# Patient Record
Sex: Male | Born: 1955 | Race: Black or African American | Hispanic: No | Marital: Single | Smoking: Never smoker
Health system: Southern US, Community
[De-identification: ages and names within clinical notes are randomized; demographics above are authoritative.]

## PROBLEM LIST (undated history)

## (undated) DIAGNOSIS — E119 Type 2 diabetes mellitus without complications: Secondary | ICD-10-CM

## (undated) DIAGNOSIS — I2510 Atherosclerotic heart disease of native coronary artery without angina pectoris: Secondary | ICD-10-CM

## (undated) DIAGNOSIS — I219 Acute myocardial infarction, unspecified: Secondary | ICD-10-CM

## (undated) DIAGNOSIS — I10 Essential (primary) hypertension: Secondary | ICD-10-CM

## (undated) DIAGNOSIS — K219 Gastro-esophageal reflux disease without esophagitis: Secondary | ICD-10-CM

## (undated) DIAGNOSIS — F32A Depression, unspecified: Secondary | ICD-10-CM

## (undated) DIAGNOSIS — N183 Chronic kidney disease, stage 3 unspecified: Secondary | ICD-10-CM

## (undated) DIAGNOSIS — I428 Other cardiomyopathies: Secondary | ICD-10-CM

## (undated) DIAGNOSIS — I639 Cerebral infarction, unspecified: Secondary | ICD-10-CM

## (undated) DIAGNOSIS — E7800 Pure hypercholesterolemia, unspecified: Secondary | ICD-10-CM

## (undated) DIAGNOSIS — F329 Major depressive disorder, single episode, unspecified: Secondary | ICD-10-CM

## (undated) DIAGNOSIS — Z992 Dependence on renal dialysis: Secondary | ICD-10-CM

## (undated) HISTORY — PX: CARDIAC CATHETERIZATION: SHX172

## (undated) HISTORY — DX: Depression, unspecified: F32.A

## (undated) HISTORY — DX: Type 2 diabetes mellitus without complications: E11.9

## (undated) HISTORY — DX: Chronic kidney disease, stage 3 (moderate): N18.3

## (undated) HISTORY — DX: Chronic kidney disease, stage 3 unspecified: N18.30

## (undated) HISTORY — DX: Atherosclerotic heart disease of native coronary artery without angina pectoris: I25.10

## (undated) HISTORY — DX: Major depressive disorder, single episode, unspecified: F32.9

---

## 2010-07-26 ENCOUNTER — Emergency Department (HOSPITAL_BASED_OUTPATIENT_CLINIC_OR_DEPARTMENT_OTHER)
Admission: EM | Admit: 2010-07-26 | Discharge: 2010-07-26 | Disposition: A | Discharge status: Home / Self Care | Payer: Medicaid Other | Attending: Emergency Medicine | Admitting: Emergency Medicine

## 2010-07-26 DIAGNOSIS — K029 Dental caries, unspecified: Secondary | ICD-10-CM | POA: Insufficient documentation

## 2010-07-26 DIAGNOSIS — K219 Gastro-esophageal reflux disease without esophagitis: Secondary | ICD-10-CM | POA: Insufficient documentation

## 2010-07-26 DIAGNOSIS — Z8679 Personal history of other diseases of the circulatory system: Secondary | ICD-10-CM | POA: Insufficient documentation

## 2010-07-26 DIAGNOSIS — E119 Type 2 diabetes mellitus without complications: Secondary | ICD-10-CM | POA: Insufficient documentation

## 2010-07-26 DIAGNOSIS — I10 Essential (primary) hypertension: Secondary | ICD-10-CM | POA: Insufficient documentation

## 2010-10-15 ENCOUNTER — Encounter: Payer: Self-pay | Admitting: *Deleted

## 2010-10-15 ENCOUNTER — Emergency Department (HOSPITAL_BASED_OUTPATIENT_CLINIC_OR_DEPARTMENT_OTHER)
Admission: EM | Admit: 2010-10-15 | Discharge: 2010-10-15 | Disposition: A | Discharge status: Home / Self Care | Payer: Medicaid Other | Attending: Emergency Medicine | Admitting: Emergency Medicine

## 2010-10-15 DIAGNOSIS — I10 Essential (primary) hypertension: Secondary | ICD-10-CM | POA: Insufficient documentation

## 2010-10-15 DIAGNOSIS — E7800 Pure hypercholesterolemia, unspecified: Secondary | ICD-10-CM | POA: Insufficient documentation

## 2010-10-15 DIAGNOSIS — I252 Old myocardial infarction: Secondary | ICD-10-CM | POA: Insufficient documentation

## 2010-10-15 DIAGNOSIS — L509 Urticaria, unspecified: Secondary | ICD-10-CM | POA: Insufficient documentation

## 2010-10-15 DIAGNOSIS — Z8679 Personal history of other diseases of the circulatory system: Secondary | ICD-10-CM | POA: Insufficient documentation

## 2010-10-15 HISTORY — DX: Acute myocardial infarction, unspecified: I21.9

## 2010-10-15 HISTORY — DX: Cerebral infarction, unspecified: I63.9

## 2010-10-15 HISTORY — DX: Pure hypercholesterolemia, unspecified: E78.00

## 2010-10-15 HISTORY — DX: Essential (primary) hypertension: I10

## 2010-10-15 MED ORDER — FAMOTIDINE IN NACL 20-0.9 MG/50ML-% IV SOLN
20.0000 mg | Freq: Once | INTRAVENOUS | Status: AC
Start: 2010-10-15 — End: 2010-10-15
  Administered 2010-10-15: 20 mg via INTRAVENOUS
  Filled 2010-10-15: qty 50

## 2010-10-15 MED ORDER — DIPHENHYDRAMINE HCL 50 MG/ML IJ SOLN
25.0000 mg | Freq: Once | INTRAMUSCULAR | Status: AC
  Administered 2010-10-15: 25 mg via INTRAVENOUS
  Filled 2010-10-15: qty 1

## 2010-10-15 MED ORDER — PREDNISONE 10 MG PO TABS: ORAL_TABLET | ORAL | Status: DC

## 2010-10-15 NOTE — ED Notes (Signed)
Lying [id number] Sitting 164/100, 78 Standing [id number]

## 2010-10-15 NOTE — ED Provider Notes (Signed)
History     CSN: [id number] Arrival date & time: 10/15/2010  5:38 PM  Chief Complaint  Patient presents with  . Allergic Reaction   Patient is a 55 y.o. male presenting with rash. The history is provided by the patient.  Rash  This is a new problem. The current episode started 12 to 24 hours ago. The problem has been gradually worsening. The problem is associated with nothing. There has been no fever. The rash is present on the torso, back, trunk, left upper leg, left lower leg, left arm, right arm, right lower leg and right upper leg. The pain is at a severity of 0/10. The patient is experiencing no pain. The pain has been constant since onset. Associated symptoms include itching. He has tried antihistamines for the symptoms. Risk factors: no known.   patient has full body rash. Patient reports no new medicines no new soaps or cosmetics. Patient reports no new foods patient's wife reports she frequently has hives however patient has never had hives in the past patient complains of severe itching  Past Medical History  Diagnosis Date  . Hypertension   . Hypercholesteremia   . Stroke   . Myocardial infarct     History reviewed. No pertinent past surgical history.  History reviewed. No pertinent family history.  History  Substance Use Topics  . Smoking status: Never Smoker   . Smokeless tobacco: Not on file  . Alcohol Use: Yes      Review of Systems  Skin: Positive for itching and rash.  All other systems reviewed and are negative.    Physical Exam  BP 147/84  Pulse 70  Temp(Src) 98.7 F (37.1 C) (Oral)  Resp 20  Ht 5\' 11"  (1.803 m)  Wt 188 lb (85.276 kg)  BMI 26.22 kg/m2  SpO2 96%  Physical Exam  Nursing note and vitals reviewed. Constitutional: He appears well-developed and well-nourished.  HENT:  Head: Normocephalic and atraumatic.  Right Ear: External ear normal.  Left Ear: External ear normal.  Eyes: Conjunctivae and EOM are normal. Pupils are equal,  round, and reactive to light.  Neck: Normal range of motion. Neck supple.  Cardiovascular: Normal rate.   Pulmonary/Chest: Effort normal.  Abdominal: Soft.  Musculoskeletal: Normal range of motion.  Neurological: He is alert.  Skin: Rash noted.  Psychiatric: He has a normal mood and affect.    ED Course  Procedures  MDM Patient reports he feels better after Pepcid and Benadryl IV rash is fading and decreased itching. Patient advised to continue Pepcid and Benadryl he is given a prescription for prednisone. He is to see his physician for recheck in 2-3 days return to the emergency department if symptoms worsen or change      Alyse Low, [place] 10/16/10 [place], [place] 10/16/10 1843  Evaluation and management procedures were performed by the PA/NP under my supervision/collaboration.   Charlena Cross, MD 10/17/10 [phone]

## 2010-10-15 NOTE — ED Notes (Signed)
Pt noticed raised reddened ares to body onset last p.m. No known exposure/ingestion to unusual or new substances.

## 2011-01-11 ENCOUNTER — Emergency Department (HOSPITAL_BASED_OUTPATIENT_CLINIC_OR_DEPARTMENT_OTHER)
Admission: EM | Admit: 2011-01-11 | Discharge: 2011-01-11 | Disposition: A | Discharge status: Home / Self Care | Payer: Medicaid Other | Attending: Emergency Medicine | Admitting: Emergency Medicine

## 2011-01-11 ENCOUNTER — Encounter (HOSPITAL_BASED_OUTPATIENT_CLINIC_OR_DEPARTMENT_OTHER): Payer: Self-pay

## 2011-01-11 DIAGNOSIS — Z79899 Other long term (current) drug therapy: Secondary | ICD-10-CM | POA: Insufficient documentation

## 2011-01-11 DIAGNOSIS — I252 Old myocardial infarction: Secondary | ICD-10-CM | POA: Insufficient documentation

## 2011-01-11 DIAGNOSIS — L509 Urticaria, unspecified: Secondary | ICD-10-CM | POA: Insufficient documentation

## 2011-01-11 DIAGNOSIS — E7800 Pure hypercholesterolemia, unspecified: Secondary | ICD-10-CM | POA: Insufficient documentation

## 2011-01-11 DIAGNOSIS — Z8679 Personal history of other diseases of the circulatory system: Secondary | ICD-10-CM | POA: Insufficient documentation

## 2011-01-11 DIAGNOSIS — H6120 Impacted cerumen, unspecified ear: Secondary | ICD-10-CM | POA: Insufficient documentation

## 2011-01-11 DIAGNOSIS — H9209 Otalgia, unspecified ear: Secondary | ICD-10-CM | POA: Insufficient documentation

## 2011-01-11 DIAGNOSIS — I10 Essential (primary) hypertension: Secondary | ICD-10-CM | POA: Insufficient documentation

## 2011-01-11 MED ORDER — DIPHENHYDRAMINE HCL 25 MG PO CAPS
ORAL_CAPSULE | ORAL | Status: AC
  Filled 2011-01-11: qty 2

## 2011-01-11 MED ORDER — FAMOTIDINE 20 MG PO TABS
20.0000 mg | ORAL_TABLET | Freq: Once | ORAL | Status: AC
Start: 2011-01-11 — End: 2011-01-11
  Administered 2011-01-11: 20 mg via ORAL
  Filled 2011-01-11: qty 1

## 2011-01-11 MED ORDER — DIPHENHYDRAMINE HCL 50 MG PO CAPS
50.0000 mg | ORAL_CAPSULE | Freq: Once | ORAL | Status: AC
  Administered 2011-01-11: 50 mg via ORAL
  Filled 2011-01-11: qty 1

## 2011-01-11 NOTE — ED Provider Notes (Signed)
History     CSN: [id number] Arrival date & time: 01/11/2011  5:55 PM   First MD Initiated Contact with Patient 01/11/11 1743      Chief Complaint  Patient presents with  . Urticaria  . Otalgia    (Consider location/radiation/quality/duration/timing/severity/associated sxs/prior treatment) Patient is a 55 y.o. male presenting with urticaria and ear pain. The history is provided by the patient. No language interpreter was used.  Urticaria This is a new problem. The current episode started today. The problem occurs constantly. The problem has been gradually worsening. The symptoms are aggravated by nothing. He has tried nothing for the symptoms. The treatment provided no relief.  Otalgia This is a chronic problem. The current episode started more than 1 week ago. There is pain in both ears. The problem occurs constantly. The problem has not changed since onset.There has been no fever. The patient is experiencing no pain. Associated symptoms include ear discharge and hearing loss.  Pt complains of wax in both ears and hives.  Pt has had hives in the past.  Past Medical History  Diagnosis Date  . Hypertension   . Hypercholesteremia   . Stroke   . Myocardial infarct     History reviewed. No pertinent past surgical history.  No family history on file.  History  Substance Use Topics  . Smoking status: Never Smoker   . Smokeless tobacco: Not on file  . Alcohol Use: Yes      Review of Systems  Unable to perform ROS HENT: Positive for hearing loss, ear pain and ear discharge.   All other systems reviewed and are negative.    Allergies  Review of patient's allergies indicates no known allergies.  Home Medications   Current Outpatient Rx  Name Route Sig Dispense Refill  . AMLODIPINE BESYLATE 10 MG PO TABS Oral Take 10 mg by mouth daily.      . ASPIRIN 81 MG PO TABS Oral Take 81 mg by mouth daily.      Marland Kitchen GLIPIZIDE 10 MG PO TABS Oral Take 10 mg by mouth 2 (two) times daily  before a meal.      . METOPROLOL SUCCINATE 100 MG PO TB24 Oral Take 50 mg by mouth daily.      Marland Kitchen OMEPRAZOLE 20 MG PO CPDR Oral Take 20 mg by mouth daily.      Marland Kitchen PRAVASTATIN SODIUM 40 MG PO TABS Oral Take 40 mg by mouth at bedtime.     . TOLTERODINE TARTRATE 2 MG PO CP24 Oral Take 2 mg by mouth daily.      Marland Kitchen PREDNISONE 10 MG PO TABS  5,4,3,2,1 taper 15 tablet 0    BP 158/86  Pulse 65  Temp(Src) 98.8 F (37.1 C) (Oral)  Resp 18  Ht 5\' 11"  (1.803 m)  Wt 180 lb (81.647 kg)  BMI 25.10 kg/m2  SpO2 96%  Physical Exam  Nursing note and vitals reviewed. Constitutional: He appears well-developed and well-nourished.  HENT:  Head: Normocephalic and atraumatic.  Nose: Nose normal.  Mouth/Throat: Oropharynx is clear and moist.       bilat tm's occluded by wax  Eyes: Conjunctivae and EOM are normal. Pupils are equal, round, and reactive to light.  Cardiovascular: Normal rate and regular rhythm.   Pulmonary/Chest: Effort normal and breath sounds normal.    ED Course  Procedures (including critical care time)  Labs Reviewed - No data to display No results found.   No diagnosis found.    MDM  reexam tms clear,  I advised benadryl  25 mg every 4 hours.  Pepcid 20 mg every 12 hours        Alyse Low, [place] 01/11/11 [phone]

## 2011-01-11 NOTE — ED Notes (Signed)
C/o hives since yesterday-oral surgeon advised pt today that both ears are stopped up

## 2011-01-11 NOTE — ED Notes (Signed)
Washed both ears out with warm water large amount  of wax out in each ear.

## 2011-01-11 NOTE — ED Provider Notes (Signed)
Medical screening examination/treatment/procedure(s) were performed by non-physician practitioner and as supervising physician I was immediately available for consultation/collaboration.   Nameer Summer A. Lauris Poag, MD 01/11/11 2125

## 2011-01-11 NOTE — ED Notes (Signed)
D/c home with ride- no Rx given- pt instructed to take Benadryl 25mg  every 4 hours and pepcid 20mg  every 12 hours by EDPA Threasa Alpha. Pt and family verbalize understanding

## 2011-01-11 NOTE — ED Notes (Signed)
EDP Sofia at bedside to reassess and discuss d/c care with pt and family

## 2011-01-16 ENCOUNTER — Encounter (HOSPITAL_BASED_OUTPATIENT_CLINIC_OR_DEPARTMENT_OTHER): Payer: Self-pay | Admitting: *Deleted

## 2011-01-16 NOTE — Progress Notes (Signed)
Dr. Landry Dyke paged and informed of cardiac hx of cardiomyopathy for dental extractions under general anesthesia.last cath '06 w/ EF 30%the patient is a pt of [facility]. Dr. Buelah Manis attempted unsuccessfully in the office.  Pt became combative. They opted to come here for gen anesthesia.  No new orders received.  Dr. Winfred Leeds to decide in am.  "It's too late now" per  [place]. Note on chart for Dr. Winfred Leeds.

## 2011-01-16 NOTE — Progress Notes (Signed)
Spoke w/ pt sister, Caren Griffins. NPO after MN. Pt to arrive at [place]. Needs istat and EKG. Will take norvasc, prilosec, and toprol.

## 2011-01-17 ENCOUNTER — Other Ambulatory Visit: Payer: Self-pay

## 2011-01-17 ENCOUNTER — Encounter (HOSPITAL_BASED_OUTPATIENT_CLINIC_OR_DEPARTMENT_OTHER): Payer: Self-pay | Admitting: Anesthesiology

## 2011-01-17 ENCOUNTER — Encounter (HOSPITAL_BASED_OUTPATIENT_CLINIC_OR_DEPARTMENT_OTHER)
Admission: RE | Disposition: A | Discharge status: Home / Self Care | Payer: Self-pay | Source: Ambulatory Visit | Attending: Oral and Maxillofacial Surgery

## 2011-01-17 ENCOUNTER — Ambulatory Visit (HOSPITAL_BASED_OUTPATIENT_CLINIC_OR_DEPARTMENT_OTHER)
Admission: RE | Admit: 2011-01-17 | Discharge: 2011-01-17 | Disposition: A | Discharge status: Home / Self Care | Payer: Medicaid Other | Source: Ambulatory Visit | Attending: Oral and Maxillofacial Surgery | Admitting: Oral and Maxillofacial Surgery

## 2011-01-17 DIAGNOSIS — Z8673 Personal history of transient ischemic attack (TIA), and cerebral infarction without residual deficits: Secondary | ICD-10-CM | POA: Insufficient documentation

## 2011-01-17 DIAGNOSIS — Z538 Procedure and treatment not carried out for other reasons: Secondary | ICD-10-CM | POA: Insufficient documentation

## 2011-01-17 DIAGNOSIS — I10 Essential (primary) hypertension: Secondary | ICD-10-CM | POA: Insufficient documentation

## 2011-01-17 DIAGNOSIS — K219 Gastro-esophageal reflux disease without esophagitis: Secondary | ICD-10-CM | POA: Insufficient documentation

## 2011-01-17 DIAGNOSIS — Z79899 Other long term (current) drug therapy: Secondary | ICD-10-CM | POA: Insufficient documentation

## 2011-01-17 DIAGNOSIS — I2510 Atherosclerotic heart disease of native coronary artery without angina pectoris: Secondary | ICD-10-CM | POA: Insufficient documentation

## 2011-01-17 DIAGNOSIS — E119 Type 2 diabetes mellitus without complications: Secondary | ICD-10-CM | POA: Insufficient documentation

## 2011-01-17 DIAGNOSIS — K029 Dental caries, unspecified: Secondary | ICD-10-CM | POA: Insufficient documentation

## 2011-01-17 DIAGNOSIS — I252 Old myocardial infarction: Secondary | ICD-10-CM | POA: Insufficient documentation

## 2011-01-17 HISTORY — DX: Gastro-esophageal reflux disease without esophagitis: K21.9

## 2011-01-17 HISTORY — DX: Other cardiomyopathies: I42.8

## 2011-01-17 HISTORY — DX: Atherosclerotic heart disease of native coronary artery without angina pectoris: I25.10

## 2011-01-17 LAB — POCT I-STAT 4, (NA,K, GLUC, HGB,HCT): Hemoglobin: 14.3 g/dL (ref 13.0–17.0)

## 2011-01-17 SURGERY — DENTAL RESTORATION/EXTRACTIONS
Anesthesia: General

## 2011-01-17 MED ORDER — LACTATED RINGERS IV SOLN
INTRAVENOUS | Status: DC
  Administered 2011-01-17: 07:00:00 via INTRAVENOUS

## 2011-01-17 MED ORDER — CLINDAMYCIN PHOSPHATE 600 MG/50ML IV SOLN: 600.0000 mg | Freq: Once | INTRAVENOUS | Status: DC

## 2011-01-17 SURGICAL SUPPLY — 33 items
BLADE SURG 15 STRL LF DISP TIS (BLADE) IMPLANT
BLADE SURG 15 STRL SS (BLADE)
BUR CROSS CUT TIS (BURR) IMPLANT
BUR CRS CUT 2.1 (BURR) IMPLANT
BUR SIDE CUT (BURR) IMPLANT
BUR SURG 4X8 MED (BURR) IMPLANT
BURR SURG 4X8 MED (BURR)
CANISTER SUCTION 2500CC (MISCELLANEOUS) IMPLANT
CLOTH BEACON ORANGE TIMEOUT ST (SAFETY) IMPLANT
ELECT NEEDLE BLADE 2-5/6 (NEEDLE) IMPLANT
ELECT REM PT RETURN 9FT ADLT (ELECTROSURGICAL)
ELECTRODE REM PT RTRN 9FT ADLT (ELECTROSURGICAL) IMPLANT
GAUZE SPONGE 4X4 16PLY XRAY LF (GAUZE/BANDAGES/DRESSINGS) IMPLANT
GLOVE BIO SURGEON STRL SZ 6.5 (GLOVE) IMPLANT
GLOVE BIOGEL PI IND STRL 6.5 (GLOVE) IMPLANT
GLOVE BIOGEL PI IND STRL 7.5 (GLOVE) IMPLANT
GLOVE BIOGEL PI INDICATOR 6.5 (GLOVE)
GLOVE BIOGEL PI INDICATOR 7.5 (GLOVE)
GLOVE ORTHO TXT STRL SZ7.5 (GLOVE) IMPLANT
GLOVE SURG SS PI 8.0 STRL IVOR (GLOVE) IMPLANT
GOWN PREVENTION PLUS LG XLONG (DISPOSABLE) IMPLANT
NEEDLE DENTAL 27 LONG (NEEDLE) IMPLANT
NS IRRIG 1000ML POUR BTL (IV SOLUTION) IMPLANT
PACK BASIN DAY SURGERY FS (CUSTOM PROCEDURE TRAY) IMPLANT
PACK ENT DAY SURGERY (CUSTOM PROCEDURE TRAY) IMPLANT
PACKING VAGINAL (PACKING) IMPLANT
PENCIL BUTTON HOLSTER BLD 10FT (ELECTRODE) IMPLANT
SUT CHROMIC 3 0 PS 2 (SUTURE) IMPLANT
SYR 50ML LL SCALE MARK (SYRINGE) IMPLANT
TOWEL OR 17X24 6PK STRL BLUE (TOWEL DISPOSABLE) IMPLANT
VESSEL CANN W0 1 W VA 30003 (MISCELLANEOUS) IMPLANT
WATER STERILE IRR 1000ML POUR (IV SOLUTION) IMPLANT
YANKAUER SUCT BULB TIP NO VENT (SUCTIONS) IMPLANT

## 2011-01-17 NOTE — H&P (Signed)
  Date of Initial H&P: 01/17/11  History reviewed, patient examined, no change in status, stable for surgery.

## 2011-01-17 NOTE — Anesthesia Preprocedure Evaluation (Addendum)
Anesthesia Evaluation  Patient identified by MRN, date of birth, ID band Patient awake    Reviewed: Allergy & Precautions, H&P , NPO status , Patient's Chart, lab work & pertinent test results, reviewed documented beta blocker date and time   History of Anesthesia Complications Negative for: history of anesthetic complications  Airway Mallampati: III TM Distance: >3 FB     Dental  (+) Poor Dentition   Pulmonary    Pulmonary exam normal       Cardiovascular Exercise Tolerance: Poor hypertension, Pt. on medications and Pt. on home beta blockers + CAD and + Past MI Regular     Neuro/Psych CVA, Residual Symptoms    GI/Hepatic Neg liver ROS, GERD-  Medicated and Controlled,  Endo/Other  Diabetes mellitus-, Well Controlled, Type 2, Oral Hypoglycemic Agents  Renal/GU negative Renal ROS     Musculoskeletal   Abdominal   Peds  Hematology negative hematology ROS (+)   Anesthesia Other Findings   Reproductive/Obstetrics                          Anesthesia Physical Anesthesia Plan  ASA: III  Anesthesia Plan: General   Post-op Pain Management:    Induction: Intravenous  Airway Management Planned: Nasal ETT  Additional Equipment:   Intra-op Plan:   Post-operative Plan: Extubation in OR  Informed Consent: I have reviewed the patients History and Physical, chart, labs and discussed the procedure including the risks, benefits and alternatives for the proposed anesthesia with the patient or authorized representative who has indicated his/her understanding and acceptance.   Dental advisory given  Plan Discussed with: CRNA  Anesthesia Plan Comments:         Anesthesia Quick Evaluation

## 2011-01-17 NOTE — Procedures (Signed)
After chart reviewed per Dr Judeth Horn made to cancel procedure based on cardiac notes from 2006-no known updated workup.

## 2011-01-17 NOTE — Progress Notes (Signed)
Procedure today (01/17/11) cancelled by anesthesia due to cardiac history.

## 2012-01-12 ENCOUNTER — Emergency Department (HOSPITAL_BASED_OUTPATIENT_CLINIC_OR_DEPARTMENT_OTHER): Payer: Medicaid Other

## 2012-01-12 ENCOUNTER — Emergency Department (HOSPITAL_BASED_OUTPATIENT_CLINIC_OR_DEPARTMENT_OTHER)
Admission: EM | Admit: 2012-01-12 | Discharge: 2012-01-12 | Disposition: A | Discharge status: Other Acute Inpatient Hospital | Payer: Medicaid Other | Attending: Emergency Medicine | Admitting: Emergency Medicine

## 2012-01-12 ENCOUNTER — Encounter (HOSPITAL_BASED_OUTPATIENT_CLINIC_OR_DEPARTMENT_OTHER): Payer: Self-pay | Admitting: *Deleted

## 2012-01-12 DIAGNOSIS — K219 Gastro-esophageal reflux disease without esophagitis: Secondary | ICD-10-CM | POA: Insufficient documentation

## 2012-01-12 DIAGNOSIS — I2510 Atherosclerotic heart disease of native coronary artery without angina pectoris: Secondary | ICD-10-CM | POA: Insufficient documentation

## 2012-01-12 DIAGNOSIS — I10 Essential (primary) hypertension: Secondary | ICD-10-CM

## 2012-01-12 DIAGNOSIS — Z7982 Long term (current) use of aspirin: Secondary | ICD-10-CM | POA: Insufficient documentation

## 2012-01-12 DIAGNOSIS — Z8673 Personal history of transient ischemic attack (TIA), and cerebral infarction without residual deficits: Secondary | ICD-10-CM | POA: Insufficient documentation

## 2012-01-12 DIAGNOSIS — Z8679 Personal history of other diseases of the circulatory system: Secondary | ICD-10-CM | POA: Insufficient documentation

## 2012-01-12 DIAGNOSIS — R29810 Facial weakness: Secondary | ICD-10-CM | POA: Insufficient documentation

## 2012-01-12 DIAGNOSIS — I252 Old myocardial infarction: Secondary | ICD-10-CM | POA: Insufficient documentation

## 2012-01-12 DIAGNOSIS — E7800 Pure hypercholesterolemia, unspecified: Secondary | ICD-10-CM | POA: Insufficient documentation

## 2012-01-12 DIAGNOSIS — R471 Dysarthria and anarthria: Secondary | ICD-10-CM | POA: Insufficient documentation

## 2012-01-12 DIAGNOSIS — Z79899 Other long term (current) drug therapy: Secondary | ICD-10-CM | POA: Insufficient documentation

## 2012-01-12 DIAGNOSIS — E119 Type 2 diabetes mellitus without complications: Secondary | ICD-10-CM | POA: Insufficient documentation

## 2012-01-12 LAB — URINALYSIS, ROUTINE W REFLEX MICROSCOPIC
Ketones, ur: NEGATIVE mg/dL
Leukocytes, UA: NEGATIVE
Nitrite: NEGATIVE
Specific Gravity, Urine: 1.012 (ref 1.005–1.030)
Urobilinogen, UA: 1 mg/dL (ref 0.0–1.0)
pH: 7 (ref 5.0–8.0)

## 2012-01-12 LAB — BASIC METABOLIC PANEL
BUN: 18 mg/dL (ref 6–23)
Chloride: 98 mEq/L (ref 96–112)
Glucose, Bld: 183 mg/dL — ABNORMAL HIGH (ref 70–99)
Potassium: 3.5 mEq/L (ref 3.5–5.1)

## 2012-01-12 LAB — APTT: aPTT: 30 seconds (ref 24–37)

## 2012-01-12 LAB — GLUCOSE, CAPILLARY

## 2012-01-12 LAB — CBC
HCT: 41.1 % (ref 39.0–52.0)
Hemoglobin: 14.3 g/dL (ref 13.0–17.0)
RBC: 5.4 MIL/uL (ref 4.22–5.81)
WBC: 5.1 10*3/uL (ref 4.0–10.5)

## 2012-01-12 NOTE — ED Notes (Signed)
Stroke swallow screen not completed due to aphasia, cough, and unable to control saliva.

## 2012-01-12 NOTE — ED Notes (Signed)
Report to Shawn RN at [facility] ED.

## 2012-01-12 NOTE — ED Provider Notes (Signed)
History     CSN: [id number]  Arrival date & time 01/12/12  1042   First MD Initiated Contact with Patient 01/12/12 1046      Chief Complaint  Patient presents with  . Aphasia    (Consider location/radiation/quality/duration/timing/severity/associated sxs/prior treatment) HPI A LEVEL 5 CAVEAT PERTAINS DUE TO DYSARTHRIA Pt presents with c/o slurred speech as well as right sided facial droop.  Hx obtained from sister who is accompanying him.  She states he went to bed last night and was feeling well, this morning woke up with these symptoms.  Has hx of stroke x 2 and MI, HTN.  She states strokes in the past have not caused these similar symptoms- but instead have caused memory problems.  Pt denies any arm or leg weakness.  No chest pain, no sob, no headache, no vomiting.    Past Medical History  Diagnosis Date  . Hypertension   . Hypercholesteremia   . Coronary artery disease     goes to [place] in [place] every 6 months  . Myocardial infarct 2006 & 2007    s/p cardiac cath 2006-  medical intervention  . Non-ischemic cardiomyopathy hx 2006-- ef 10-15%    last echo documented 2006 w/ chart  . Stroke one yrs ago & one 2008     residual - periphral vision limited  . Diabetes mellitus     oral med  . GERD (gastroesophageal reflux disease)     controlled w/ prilosec    Past Surgical History  Procedure Date  . Cardiac catheterization 2006 in [place]    results w/ chart (provided by sister)    No family history on file.  History  Substance Use Topics  . Smoking status: Never Smoker   . Smokeless tobacco: Not on file  . Alcohol Use: Yes     Comment: occassionally      Review of Systems UNABLE TO OBTAIN FULL ROS DUE TO LEVEL 5 CAVEAT  Allergies  Review of patient's allergies indicates no known allergies.  Home Medications   Current Outpatient Rx  Name  Route  Sig  Dispense  Refill  . ASPIRIN 81 MG PO TABS   Oral   Take 81 mg by mouth every morning.         Marland Kitchen LISINOPRIL 20 MG PO TABS   Oral   Take 20 mg by mouth daily.         Marland Kitchen AMLODIPINE BESYLATE 10 MG PO TABS   Oral   Take 10 mg by mouth every morning.          Marland Kitchen DIPHENHYDRAMINE HCL (SLEEP) 25 MG PO TABS   Oral   Take 25 mg by mouth as needed. For hives          . FAMOTIDINE 20 MG PO TABS   Oral   Take 20 mg by mouth 2 (two) times daily as needed.           Marland Kitchen GLIPIZIDE 10 MG PO TABS   Oral   Take 10 mg by mouth 2 (two) times daily before a meal.          . METOPROLOL SUCCINATE ER 100 MG PO TB24   Oral   Take 50 mg by mouth every morning.          Marland Kitchen OMEPRAZOLE 20 MG PO CPDR   Oral   Take 20 mg by mouth every morning.          Marland Kitchen PRAVASTATIN SODIUM 40 MG  PO TABS   Oral   Take 40 mg by mouth at bedtime.          . TOLTERODINE TARTRATE ER 2 MG PO CP24   Oral   Take 2 mg by mouth every morning.            BP 136/88  Pulse 83  Temp 98.7 F (37.1 C) (Oral)  Resp 16  SpO2 99% Vitals reviewed Physical Exam Physical Examination: General appearance - alert, well appearing, and in no distress Mental status - alert, oriented to person, place, and time Eyes - pupils equal and reactive, extraocular eye movements intact Mouth - mucous membranes moist, pharynx normal without lesions Chest - clear to auscultation, no wheezes, rales or rhonchi, symmetric air entry Heart - normal rate, regular rhythm, normal S1, S2, no murmurs, rubs, clicks or gallops Abdomen - soft, nontender, nondistended, no masses or organomegaly Neurological - alert, oriented, dysarthric speech,right sided facial droop, remainder of cranial nerves tested and intact, strength 5/5 in extremities x 4, sensation intact Extremities - peripheral pulses normal, no pedal edema, no clubbing or cyanosis Skin - normal coloration and turgor, no rashes  ED Course  Procedures (including critical care time)   Date: 01/12/2012  Rate: 88  Rhythm: normal sinus rhythm  QRS Axis: normal  Intervals:  NORMAL  ST/T Wave abnormalities: nonspecific st /t wave abnormalities  Conduction Disutrbances:none  Narrative Interpretation:   Old EKG Reviewed: unchanged, no significant changes compared to prior ekg of 02/16/11    2:07 PM d/w Patient and his sister.  They state they would prefer to be admitted at [facility].  I have d/w Dr. Lynnae Sandhoff, [facility]. He accepts the patient and wants him to be transferred to the ED- he states he will decide on what type of bed and admit him from the ED.  He does state that he is the accepting physician.  Dr. Idolina Primer is the ED phsyician.   Labs Reviewed  CBC - Abnormal; Notable for the following:    MCV 76.1 (*)     All other components within normal limits  BASIC METABOLIC PANEL - Abnormal; Notable for the following:    Glucose, Bld 183 (*)     GFR calc non Af Amer 60 (*)     GFR calc Af Amer 70 (*)     All other components within normal limits  GLUCOSE, CAPILLARY - Abnormal; Notable for the following:    Glucose-Capillary 194 (*)     All other components within normal limits  PROTIME-INR  APTT  URINALYSIS, ROUTINE W REFLEX MICROSCOPIC   Ct Head Wo Contrast  01/12/2012  *RADIOLOGY REPORT*  Clinical Data: Facial drooping and slurred speech occurring during the night.  CT HEAD WITHOUT CONTRAST  Technique:  Contiguous axial images were obtained from the base of the skull through the vertex without contrast.  Comparison: None.  Findings: There is ex vacuo dilation of the left lateral ventricle from an old left external capsule infarct.  The ventricles are normal in configuration otherwise and, along the sulci, show mild enlargement reflecting mild atrophy.  There are patchy areas of white matter hypoattenuation consistent with mild chronic micro mass ischemic change and several lacunar infarcts are noted in the basal ganglia and thalami.  No evidence of a recent infarct.  There are no extra-axial masses or abnormal fluid collections.  No  intracranial hemorrhage.  Mucosal thickening lines the ethmoid sinuses and inferior frontal sinus with a small mucous retention cyst  in the left maxillary sinus.  Clear mastoid air cells.  No skull fracture.  IMPRESSION: No acute intracranial abnormalities.   Original Report Authenticated By: Lajean Manes, M.D.      1. Dysarthria   2. Facial droop   3. Hypertension       MDM  Pt presents with onset of facial droop and dysarthria which he noted upon waking up from sleep this morning.  CT head reassuring, EKG, labs also reassuring.  Pt and family requesting to be transferred to [facility].  D/w regional physicians who accepts patient, but requests him to be transferred to the ED where hospitalist will evaluate.  Dr. Idolina Primer in ED also aware of patient.          Threasa Beards, MD 01/12/12 [phone]

## 2012-01-12 NOTE — ED Notes (Signed)
The patient's CBG was 194.

## 2012-01-12 NOTE — ED Notes (Signed)
Sister of patient states she woke up her brother this morning at 0930 am and he was having slurred speech, drooping of his right face and swallowing problems.  States pt went to bed last night at 1030 pm normal.  Hx of 2 strokes in the past and 2 heart attacks.  States only residual effect of past strokes is a mental process slowness, no asphasia, or weakness. Patient walks daily for 1-2 hours at a time.

## 2013-10-26 IMAGING — CT CT HEAD W/O CM
1 series · 16 of 30 positions shown, 20 images · non-contrast
Comparison: None.

CLINICAL DATA: Facial drooping and slurred speech occurring during
the night.

CT HEAD WITHOUT CONTRAST
TECHNIQUE: Contiguous axial images were obtained from the base of
the skull through the vertex without contrast.

[Series 2: head 4.8 h37s · axial · 0.50mm/px · z∈[-134,+2]mm · 16 of 32 slices shown, 20 images]
[im 2/32  brain]
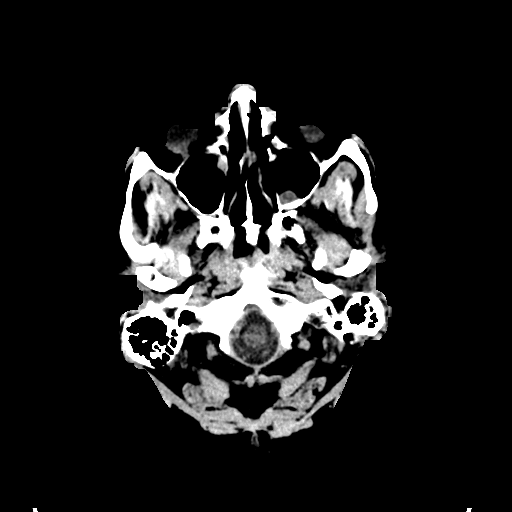
[im 2/32  bone]
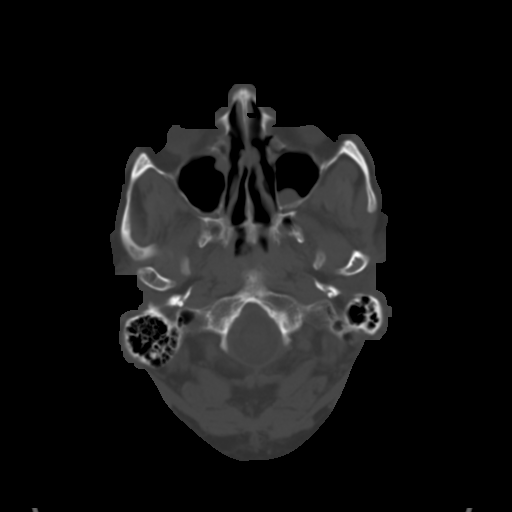
[im 4/32  brain]
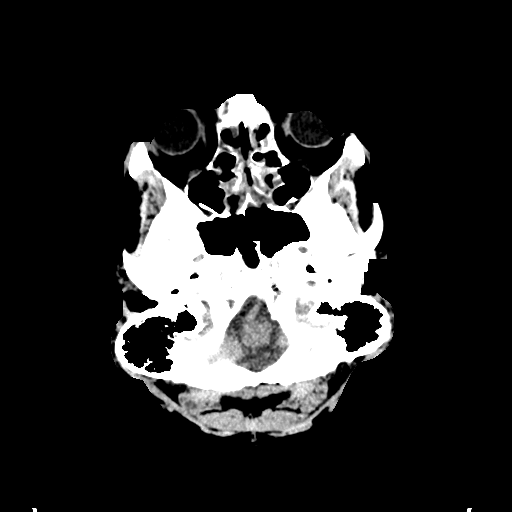
[im 6/32  brain]
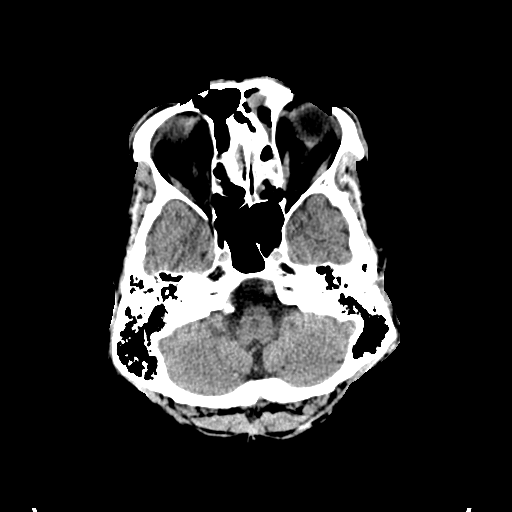
[im 8/32  brain]
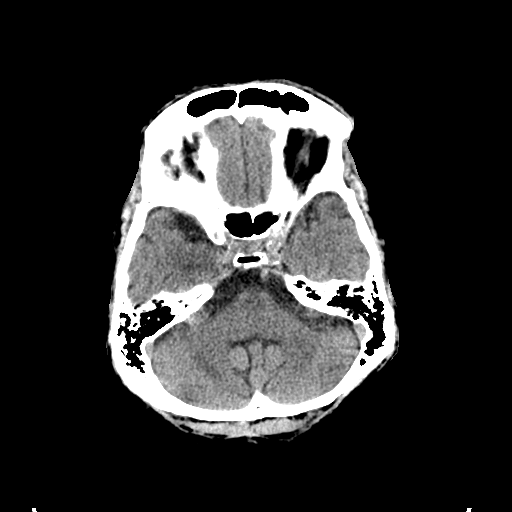
[im 9/32  brain]
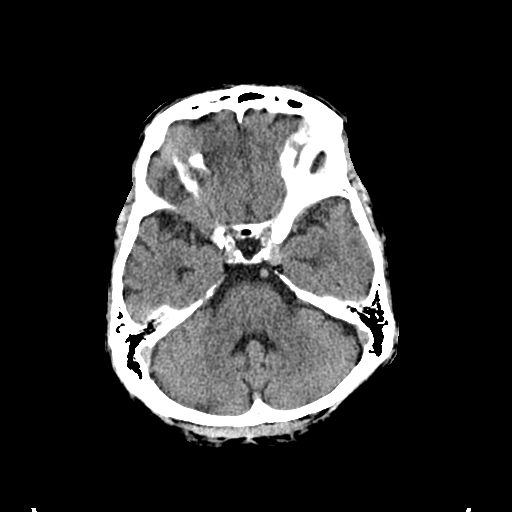
[im 9/32  bone]
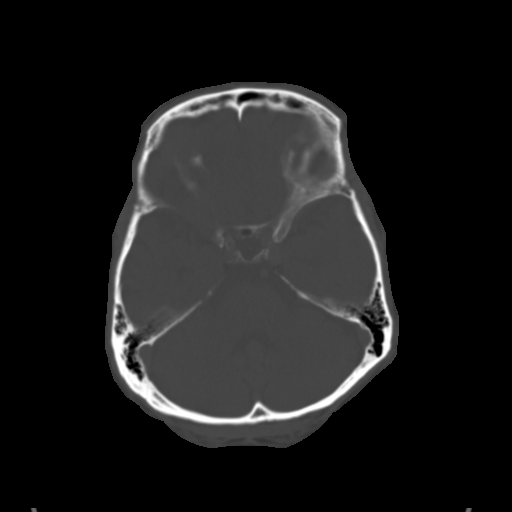
[im 11/32  brain]
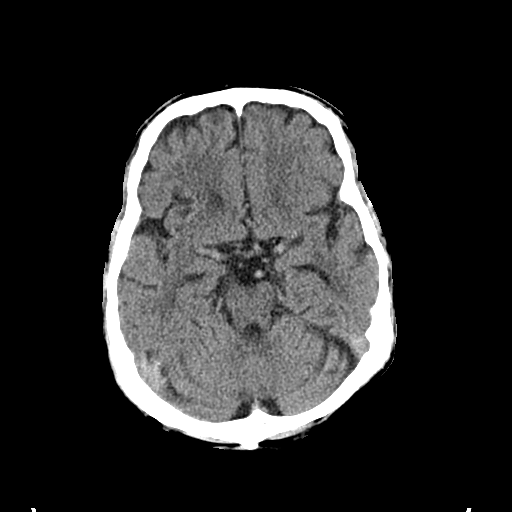
[im 13/32  brain]
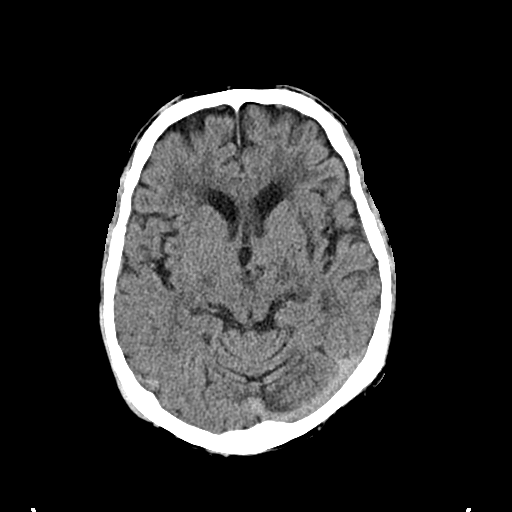
[im 15/32  brain]
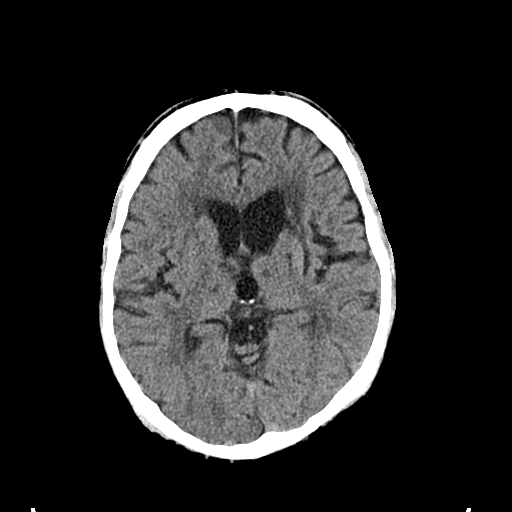
[im 17/32  brain]
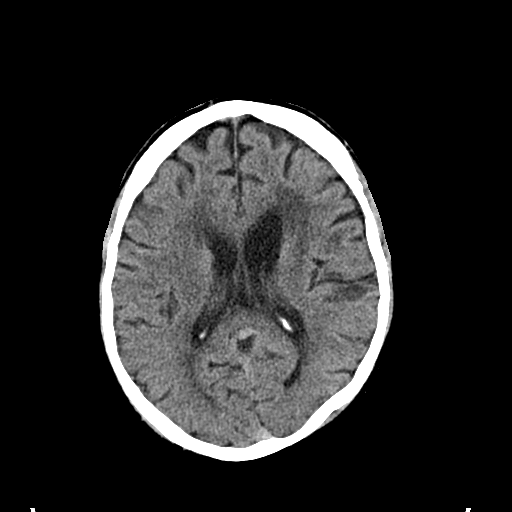
[im 17/32  bone]
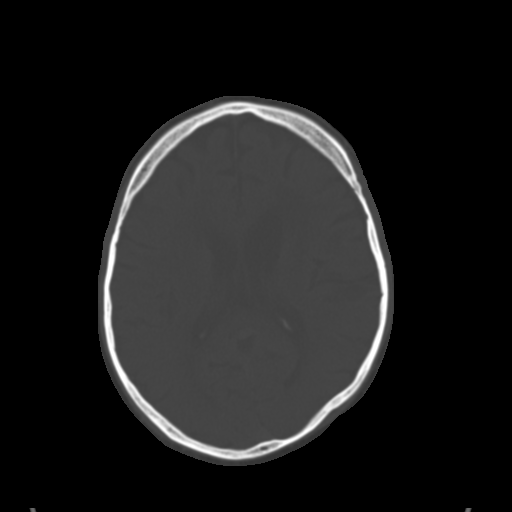
[im 19/32  brain]
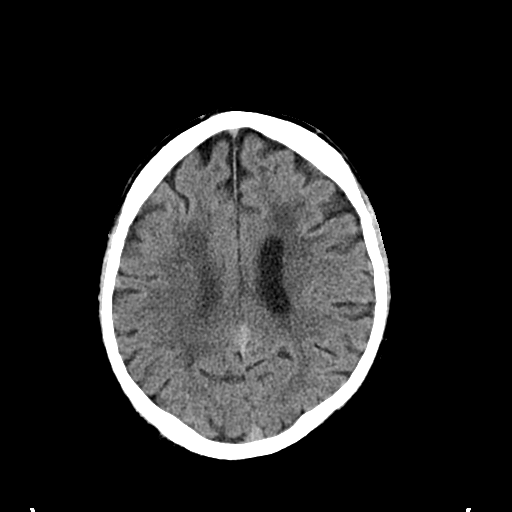
[im 21/32  brain]
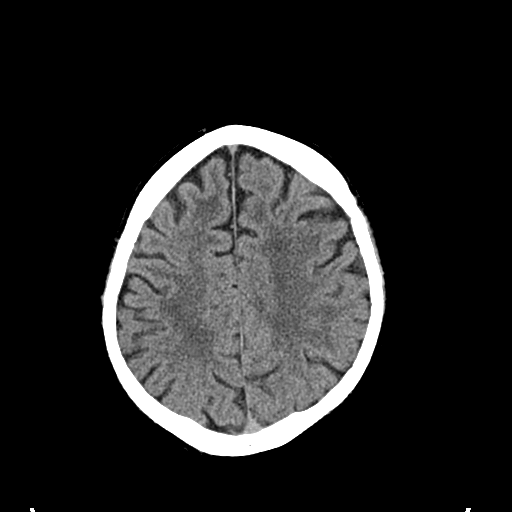
[im 23/32  brain]
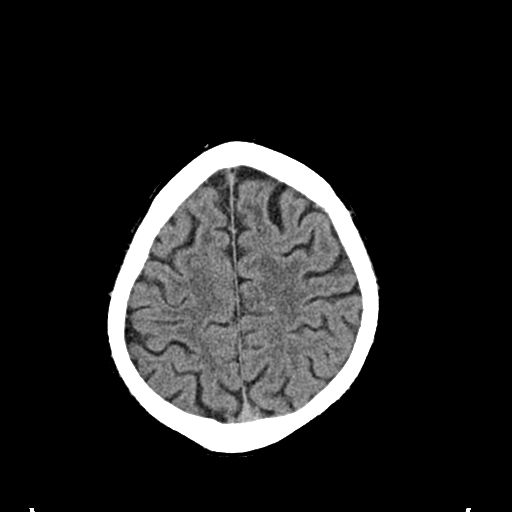
[im 24/32  brain]
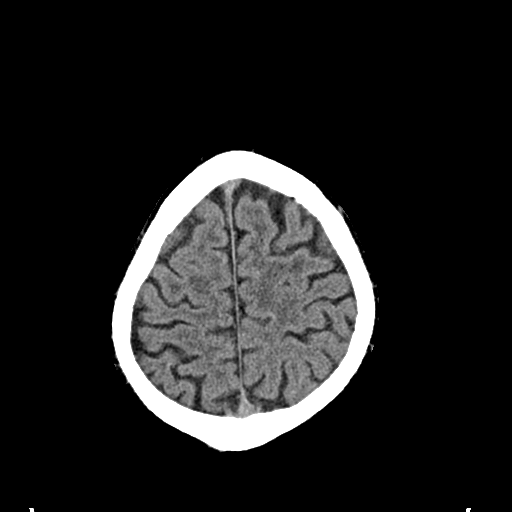
[im 24/32  bone]
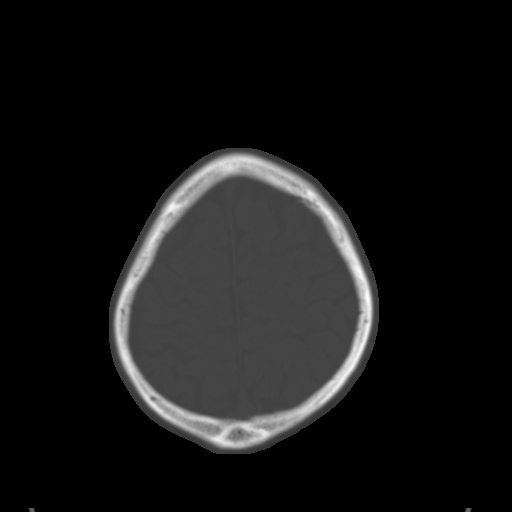
[im 26/32  brain]
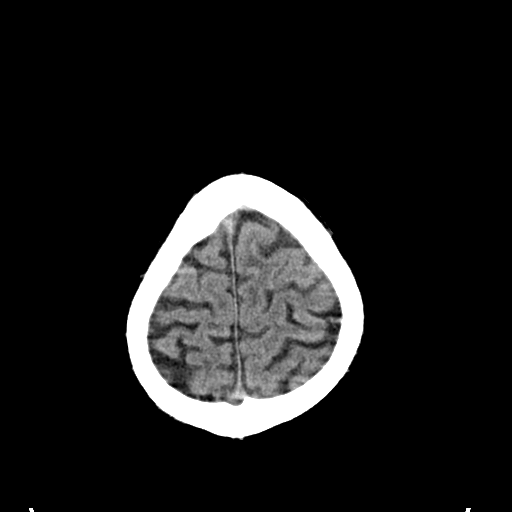
[im 28/32  brain]
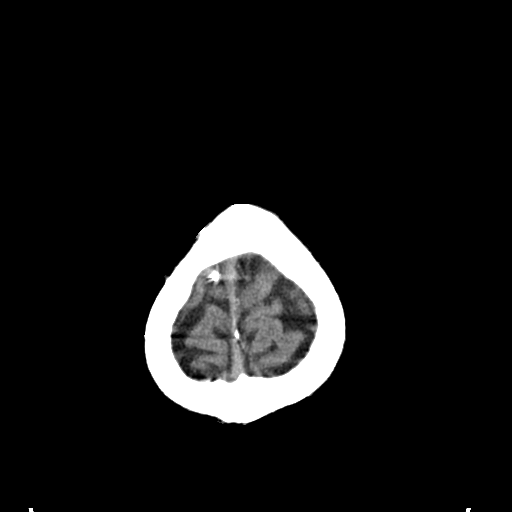
[im 30/32  brain]
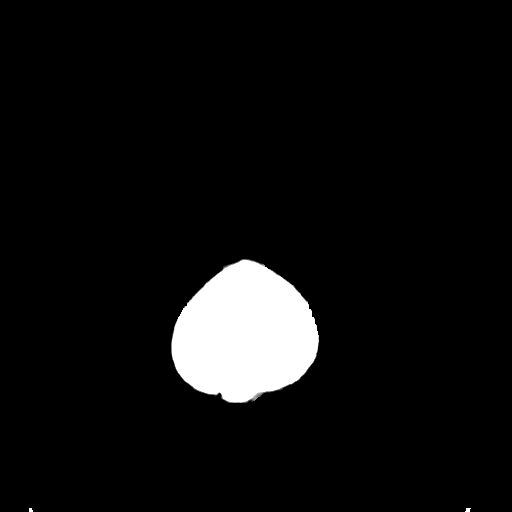

[16 of 30 positions shown; findings below may reference images not displayed]

FINDINGS: There is ex vacuo dilation of the left lateral ventricle
from an old left external capsule infarct.  The ventricles are
normal in configuration otherwise and, along the sulci, show mild
enlargement reflecting mild atrophy.  There are patchy areas of
white matter hypoattenuation consistent with mild chronic micro
mass ischemic change and several lacunar infarcts are noted in the
basal ganglia and thalami.

No evidence of a recent infarct.

There are no extra-axial masses or abnormal fluid collections.

No intracranial hemorrhage.

Mucosal thickening lines the ethmoid sinuses and inferior frontal
sinus with a small mucous retention cyst in the left maxillary
sinus.  Clear mastoid air cells.  No skull fracture.
IMPRESSION: No acute intracranial abnormalities.

## 2013-11-25 ENCOUNTER — Encounter: Payer: Self-pay | Admitting: Podiatry

## 2013-11-25 ENCOUNTER — Ambulatory Visit (INDEPENDENT_AMBULATORY_CARE_PROVIDER_SITE_OTHER): Payer: Medicaid Other | Admitting: Podiatry

## 2013-11-25 VITALS — Ht 72.0 in | Wt 186.0 lb

## 2013-11-25 DIAGNOSIS — M79673 Pain in unspecified foot: Secondary | ICD-10-CM | POA: Insufficient documentation

## 2013-11-25 DIAGNOSIS — M79609 Pain in unspecified limb: Secondary | ICD-10-CM

## 2013-11-25 DIAGNOSIS — M109 Gout, unspecified: Secondary | ICD-10-CM | POA: Insufficient documentation

## 2013-11-25 DIAGNOSIS — M79672 Pain in left foot: Secondary | ICD-10-CM

## 2013-11-25 NOTE — Progress Notes (Signed)
Subjective: 58 year old male presents complaining of pain in left great joint x 2 weeks. Been diabetic for 15 years. He had blood work and X-rays done at PCP and the result was negative. Patient accompanied by his sister, the care giver. He is getting gouty attack about once a year.   Review of Systems - General ROS: negative for - chills, fatigue, fever or night sweats Ophthalmic ROS: Poor right eye sight since first stroke. ENT ROS: negative Allergy and Immunology ROS: negative Respiratory ROS: no cough, shortness of breath, or wheezing Cardiovascular ROS: no chest pain or dyspnea on exertion Gastrointestinal ROS: no abdominal pain, change in bowel habits, or black or bloody stools Genito-Urinary ROS: no dysuria, trouble voiding, or hematuria Musculoskeletal ROS: negative Neurological ROS: no TIA or stroke symptoms Dermatological ROS: negative.  Objective: Dermatologic: Normotrophic skin without any skin lesions. Vascular: All pedal pulses are palpable. Mild forefoot edema left foot. Mild erythema at the first MPJ left foot with limited joint motion. Neurologic: All epicritic and tactile sensations grossly intact. Normal response to Monofilament and Vibratory sensation testing.  Orthopedic: Tight Achilles tendon both lower limbs.  Assessment: Gouty arthritis left foot with swollen  inflamed first MPJ.   Plan:  Reviewed findings. Injected left first MPJ with mixture of 4 mg Dexamethasone, 4 mg Triamcinolone, and 1 cc of 0.5% Marcaine plain. Patient tolerated well without difficulty.  Return if pain persist.

## 2013-11-25 NOTE — Patient Instructions (Signed)
Seen for pain in left big joint. Cortisone injection given. Return next week if pain persist.

## 2015-04-07 HISTORY — PX: JOINT REPLACEMENT: SHX530

## 2015-04-26 ENCOUNTER — Encounter: Payer: Self-pay | Admitting: Internal Medicine

## 2015-04-26 ENCOUNTER — Non-Acute Institutional Stay (SKILLED_NURSING_FACILITY): Payer: Medicaid Other | Admitting: Internal Medicine

## 2015-04-26 ENCOUNTER — Non-Acute Institutional Stay: Payer: Self-pay | Admitting: Internal Medicine

## 2015-04-26 DIAGNOSIS — F32A Depression, unspecified: Secondary | ICD-10-CM

## 2015-04-26 DIAGNOSIS — S72142D Displaced intertrochanteric fracture of left femur, subsequent encounter for closed fracture with routine healing: Secondary | ICD-10-CM | POA: Diagnosis not present

## 2015-04-26 DIAGNOSIS — E119 Type 2 diabetes mellitus without complications: Secondary | ICD-10-CM

## 2015-04-26 DIAGNOSIS — D62 Acute posthemorrhagic anemia: Secondary | ICD-10-CM | POA: Diagnosis not present

## 2015-04-26 DIAGNOSIS — N183 Chronic kidney disease, stage 3 unspecified: Secondary | ICD-10-CM

## 2015-04-26 DIAGNOSIS — E785 Hyperlipidemia, unspecified: Secondary | ICD-10-CM

## 2015-04-26 DIAGNOSIS — J189 Pneumonia, unspecified organism: Secondary | ICD-10-CM

## 2015-04-26 DIAGNOSIS — Z8673 Personal history of transient ischemic attack (TIA), and cerebral infarction without residual deficits: Secondary | ICD-10-CM

## 2015-04-26 DIAGNOSIS — I2510 Atherosclerotic heart disease of native coronary artery without angina pectoris: Secondary | ICD-10-CM

## 2015-04-26 DIAGNOSIS — I10 Essential (primary) hypertension: Secondary | ICD-10-CM

## 2015-04-26 DIAGNOSIS — E118 Type 2 diabetes mellitus with unspecified complications: Secondary | ICD-10-CM

## 2015-04-26 DIAGNOSIS — F329 Major depressive disorder, single episode, unspecified: Secondary | ICD-10-CM

## 2015-04-26 DIAGNOSIS — F0390 Unspecified dementia without behavioral disturbance: Secondary | ICD-10-CM

## 2015-04-29 ENCOUNTER — Non-Acute Institutional Stay (SKILLED_NURSING_FACILITY): Payer: Medicaid Other | Admitting: Internal Medicine

## 2015-04-29 ENCOUNTER — Encounter: Payer: Self-pay | Admitting: Internal Medicine

## 2015-04-29 DIAGNOSIS — E118 Type 2 diabetes mellitus with unspecified complications: Secondary | ICD-10-CM | POA: Insufficient documentation

## 2015-04-29 DIAGNOSIS — S72142D Displaced intertrochanteric fracture of left femur, subsequent encounter for closed fracture with routine healing: Secondary | ICD-10-CM | POA: Diagnosis not present

## 2015-04-29 DIAGNOSIS — J189 Pneumonia, unspecified organism: Secondary | ICD-10-CM | POA: Diagnosis not present

## 2015-04-29 DIAGNOSIS — S72142A Displaced intertrochanteric fracture of left femur, initial encounter for closed fracture: Secondary | ICD-10-CM | POA: Insufficient documentation

## 2015-04-29 DIAGNOSIS — N189 Chronic kidney disease, unspecified: Secondary | ICD-10-CM

## 2015-04-29 DIAGNOSIS — N183 Chronic kidney disease, stage 3 unspecified: Secondary | ICD-10-CM | POA: Insufficient documentation

## 2015-04-29 DIAGNOSIS — D62 Acute posthemorrhagic anemia: Secondary | ICD-10-CM | POA: Diagnosis not present

## 2015-04-29 NOTE — Progress Notes (Signed)
Patient ID: Austin Levy, male   DOB: 02-20-1956, 60 y.o.   MRN: [id number]   This is an acute visit.  Level care skilled.  [facility].  Chief complaint acute visit status post hospitalization for left hip fracture with repair.  History of present illness.  Patient is a 60 year old male who presented to the ED after a fall apparently he lost his balance and fell on his right hip and sustained an acute comminuted fracture of the left hip with varus angulation.  He had a status post IM nailing of the left hip-postop course was fairly unremarkable and appears he is here for essentially rehabilitation he has been discharged on Lovenox for DVT prophylaxis.  Family also had a fever in the hospital that was thought possibly left lower lobe pneumonia he is finishing a course of Levaquin.  Anemia was thought most likely postop hemoglobin was stable.  He does have a history diabetes type 2 Glucophage was held he is on sliding scale and glipizide.  Currently patient has no complaints orthopedic doctor did recent see him and said consider tramadol and diazepam if he has increased pain with anxiety but at this point per discussion with therapy he is doing a bit better he does have oxycodone 5 mg when necessary  Previous medical history.  History of left hip femur fracture.  Pneumonia.  Leukocytosis.  Acute kidney injury.  Fever.  Conjunctivitis.  History of stroke.  Depression.  Diabetes type 2.  Coronary artery disease.  Hypertension.  Chronic kidney disease stage III. Medications.  [place] 10 mg daily when necessary.  Ciprofloxacin of the myotic solution 1 drop both eyes 4 times a day for 5 days 1 drop every 4 hours while awake for the next 5 days.  Lovenox 30 mg daily.  Levaquin 750 mg every other day for total 4 doses starting on February 20.  Code on 5 mg every 6 hours when necessary.  MiraLAX daily for up to 3 days.  As needed.  Prozac 10 mg  daily.  Norvasc 10 mg daily.  Aspirin 81 mg daily.  Ferrous sulfate 325 mg daily.  Glipizide 10 mg twice a day.  Hydralazine 100 mg twice a day.  Prilosec 20 mg daily.  Plavix 75 mg daily a.m.  Pravastatin 40 mg daily.  Vitamin D3 thousand units every morning.   Family medical social history reviewed per discharge summary on 04/25/2015.  Review of systems.  In general does not complain of fever or chills.  Skin does not complain of rashes or itching.  Eyes no visual changes.  Your nose mouth and throat does not complaining of sore throat or nasal discharge.  Respiratory does not complain of shortness of breath or cough.  Cardiac no chest pain.  GI is not complaining of nausea or vomiting abdominal discomfort or GI pain.  GU does not complain of dysuria.  Muscle skeletal apparently hip pain appears to be under somewhat better control per speaking with therapy patient does not really complain of the hip pain day while sitting still.  Neurologic did not complain of numbness or dizziness.  It psych does not history depression does not complaining of overt depression or anxiety currently apparently there issome history this.  Physical exam.  Temperature is 98.2 pulse 83 respirations 20 blood pressure 144/82.  In general this is a pleasant middle-aged male in no distress.  His skin is warm and dry there is covering over his left hip area.  Eyes pupils appear reactive  light visual acuity appears grossly intact.  Oropharynx is clear he is largely edentulous mucous membranes moist.  Chest is clear to auscultation there is no labored breathing.  Heart is regular rate and rhythm without murmur gallop or rub pedal pulses are present there is no significant lower extremity edema.  Abdomen is protuberant soft nontender with positive bowel sounds.  Muscle skeletal he does ambulate a wheelchair I did not note any deformities strength appears to be intact upper  extremities lower extremity somewhat difficult to assess but he is able to move lower extremities again quite limited left leg secondary to recent hip fracture he does have again covering over his left hip.  Neurologic grossly intact there were no lateralizing findings.  Psyche is oriented to self is pleasant.  Labs.  04/19/2015.  WBC 5.0 hemoglobin 8.6 platelets 256.  Sodium 139 potassium 3.9.  Liver function tests within normal limits.  Assessment and plan.  #1-history of left hip fracture with repair-appears to be stable this regards pain management will have to be watched he continues on oxycodone--has had orthopedic follow-up-he is on Lovenox for DVT prophylaxis.  #2 history of anemia thought to be postop-this will have to be watched will update a CBC as well as recent hemoglobin 8.6 on February 13-.--He is on iron  #3 history of pneumonia he is finishing a course of Levaquin physical exam was fairly benign today he does not really complain of increased cough or shortness of breath currently.  #4 hypertension recent blood pressures 144/82-132/87-133/70-he continues on Norvasc 10 mg a day as well as hydralazine 100 mg twice a day.--We'll update a BMP  #5 history coronary artery disease at this point relatively asymptomatic he is on aspirin 81 mg a day as well as a statin will call 40 mg daily at bedtime-she also continues on Plavix.  #6 history diabetes type 2 continues on glipizide at this point will monitor CBGs would have fairly minimal readings so far  #7 history of depression he continues on Prozac at this point will monitor.  [id number] note greater than 35 minutes spent assessing patient-reviewing his chart-discussing his status with nursing staff and physical therapy-and coordinating and formulating a plan of care for numerous diagnoses-note greater than 50% of time spent coordinating plan of care        .

## 2015-05-01 ENCOUNTER — Encounter: Payer: Self-pay | Admitting: Internal Medicine

## 2015-05-01 DIAGNOSIS — F0390 Unspecified dementia without behavioral disturbance: Secondary | ICD-10-CM | POA: Insufficient documentation

## 2015-05-01 DIAGNOSIS — F329 Major depressive disorder, single episode, unspecified: Secondary | ICD-10-CM | POA: Insufficient documentation

## 2015-05-01 DIAGNOSIS — Z8673 Personal history of transient ischemic attack (TIA), and cerebral infarction without residual deficits: Secondary | ICD-10-CM | POA: Insufficient documentation

## 2015-05-01 DIAGNOSIS — E119 Type 2 diabetes mellitus without complications: Secondary | ICD-10-CM | POA: Insufficient documentation

## 2015-05-01 DIAGNOSIS — N183 Chronic kidney disease, stage 3 unspecified: Secondary | ICD-10-CM | POA: Insufficient documentation

## 2015-05-01 DIAGNOSIS — E785 Hyperlipidemia, unspecified: Secondary | ICD-10-CM | POA: Insufficient documentation

## 2015-05-01 DIAGNOSIS — J189 Pneumonia, unspecified organism: Secondary | ICD-10-CM | POA: Insufficient documentation

## 2015-05-01 DIAGNOSIS — D62 Acute posthemorrhagic anemia: Secondary | ICD-10-CM | POA: Insufficient documentation

## 2015-05-01 DIAGNOSIS — S72142A Displaced intertrochanteric fracture of left femur, initial encounter for closed fracture: Secondary | ICD-10-CM | POA: Insufficient documentation

## 2015-05-01 DIAGNOSIS — I2510 Atherosclerotic heart disease of native coronary artery without angina pectoris: Secondary | ICD-10-CM | POA: Insufficient documentation

## 2015-05-01 DIAGNOSIS — F32A Depression, unspecified: Secondary | ICD-10-CM | POA: Insufficient documentation

## 2015-05-01 DIAGNOSIS — I10 Essential (primary) hypertension: Secondary | ICD-10-CM | POA: Insufficient documentation

## 2015-05-01 NOTE — Progress Notes (Signed)
MRN: [id number] Name: Austin Levy  Sex: male Age: 60 y.o. DOB: 01/11/1956  [facility] #: [facility] Facility/Room:421 Level Of Care: SNF Provider: Inocencio Homes D Emergency Contacts: No emergency contact information on file.  Code Status:   Allergies: Review of patient's allergies indicates no known allergies.  Chief Complaint  Patient presents with  . New Admit To SNF    HPI: Patient is 60 y.o. male with DM, HTN, s/p CVA, CAD who was admitted to [facility] from 2/12-19 fpr repair of L intertrochanteric fracture after a fall from standing. Hospital course was complicated by a post -op PNA and AKI along with acute conjunctivitis. Pt is admitted to SNF with generalized weakness for OT/PT. While at SNF pt will be followed for HTN, tx with norvasc and hydralazine, M2, tx with glipixide and HLD tx with pravachol.  Past Medical History  Diagnosis Date  . Diabetes mellitus without complication ([place])   . Hypertension   . Depression   . Stroke ([place])   . CAD (coronary artery disease)   . CKD (chronic kidney disease) stage 3, GFR 30-59 ml/min     Past Surgical History  Procedure Laterality Date  . Joint replacement Left 04/2015    HIP      Medication List       This list is accurate as of: 04/26/15 11:59 PM.  Always use your most recent med list.               amLODipine 10 MG tablet  Commonly known as:  NORVASC  Take 10 mg by mouth daily.     aspirin 81 MG tablet  Take 81 mg by mouth daily.     bisacodyl 5 MG EC tablet  Commonly known as:  DULCOLAX  Take 10 mg by mouth daily as needed for moderate constipation.     cholecalciferol 1000 units tablet  Commonly known as:  VITAMIN D  Take 1,000 Units by mouth daily.     ciprofloxacin 0.3 % ophthalmic solution  Commonly known as:  CILOXAN  Place 1 drop into both eyes QID. Administer 1 drop, every 2 hours, while awake, for 2 days. Then 1 drop, every 4 hours, while awake, for the next 5 days.     clopidogrel 75 MG tablet   Commonly known as:  PLAVIX  Take 75 mg by mouth daily.     enoxaparin 30 MG/0.3ML injection  Commonly known as:  LOVENOX  Inject 30 mg into the skin daily.     ferrous sulfate 325 (65 FE) MG tablet  Take 325 mg by mouth daily with breakfast.     FLUoxetine 10 MG capsule  Commonly known as:  PROZAC  Take 10 mg by mouth daily.     glipiZIDE 10 MG tablet  Commonly known as:  GLUCOTROL  Take 10 mg by mouth 2 (two) times daily before a meal.     hydrALAZINE 100 MG tablet  Commonly known as:  APRESOLINE  Take 100 mg by mouth 2 (two) times daily.     levofloxacin 750 MG tablet  Commonly known as:  LEVAQUIN  Take 750 mg by mouth daily. For 4 doses     omeprazole 20 MG capsule  Commonly known as:  PRILOSEC  Take 20 mg by mouth daily.     oxycodone 5 MG capsule  Commonly known as:  OXY-IR  Take 5 mg by mouth every 6 (six) hours as needed.     polyethylene glycol packet  Commonly known as:  MIRALAX / GLYCOLAX  Take 17 g by mouth daily as needed.     pravastatin 40 MG tablet  Commonly known as:  PRAVACHOL  Take 40 mg by mouth daily.        Meds ordered this encounter  Medications  . bisacodyl (DULCOLAX) 5 MG EC tablet    Sig: Take 10 mg by mouth daily as needed for moderate constipation.  . ciprofloxacin (CILOXAN) 0.3 % ophthalmic solution    Sig: Place 1 drop into both eyes QID. Administer 1 drop, every 2 hours, while awake, for 2 days. Then 1 drop, every 4 hours, while awake, for the next 5 days.  Marland Kitchen enoxaparin (LOVENOX) 30 MG/0.3ML injection    Sig: Inject 30 mg into the skin daily.  Marland Kitchen levofloxacin (LEVAQUIN) 750 MG tablet    Sig: Take 750 mg by mouth daily. For 4 doses  . oxycodone (OXY-IR) 5 MG capsule    Sig: Take 5 mg by mouth every 6 (six) hours as needed.  . polyethylene glycol (MIRALAX / GLYCOLAX) packet    Sig: Take 17 g by mouth daily as needed.  Marland Kitchen FLUoxetine (PROZAC) 10 MG capsule    Sig: Take 10 mg by mouth daily.  Marland Kitchen amLODipine (NORVASC) 10 MG tablet     Sig: Take 10 mg by mouth daily.  Marland Kitchen aspirin 81 MG tablet    Sig: Take 81 mg by mouth daily.  . ferrous sulfate 325 (65 FE) MG tablet    Sig: Take 325 mg by mouth daily with breakfast.  . glipiZIDE (GLUCOTROL) 10 MG tablet    Sig: Take 10 mg by mouth 2 (two) times daily before a meal.  . hydrALAZINE (APRESOLINE) 100 MG tablet    Sig: Take 100 mg by mouth 2 (two) times daily.  Marland Kitchen omeprazole (PRILOSEC) 20 MG capsule    Sig: Take 20 mg by mouth daily.  . clopidogrel (PLAVIX) 75 MG tablet    Sig: Take 75 mg by mouth daily.  . pravastatin (PRAVACHOL) 40 MG tablet    Sig: Take 40 mg by mouth daily.  . cholecalciferol (VITAMIN D) 1000 units tablet    Sig: Take 1,000 Units by mouth daily.     There is no immunization history on file for this patient.  Social History  Substance Use Topics  . Smoking status: Never Smoker   . Smokeless tobacco: Never Used  . Alcohol Use: Yes    Family history is UTO, pt can't remember any illness  Review of Systems  DATA OBTAINED: from patient, nurse GENERAL:  no fevers, fatigue, appetite changes SKIN: No itching, rash or wounds EYES: No eye pain, redness, discharge EARS: No earache, tinnitus, change in hearing NOSE: No congestion, drainage or bleeding  MOUTH/THROAT: No mouth or tooth pain, No sore throat RESPIRATORY: No cough, wheezing, SOB CARDIAC: No chest pain, palpitations, lower extremity edema  GI: No abdominal pain, No N/V/D or constipation, No heartburn or reflux  GU: No dysuria, frequency or urgency, or incontinence  MUSCULOSKELETAL: No unrelieved bone/joint pain NEUROLOGIC: No headache, dizziness or focal weakness PSYCHIATRIC: No c/o anxiety or sadness   Filed Vitals:   05/01/15 1355  BP: 145/90  Pulse: 80  Temp: 97.9 F (36.6 C)  Resp: 18    SpO2 Readings from Last 1 Encounters:  No data found for SpO2        Physical Exam  GENERAL APPEARANCE: Alert, mod conversant,  No acute distress.  SKIN: No diaphoresis  rash HEAD: Normocephalic, atraumatic  EYES: Conjunctiva/lids clear. Pupils round, reactive. EOMs intact.  EARS: External exam WNL, canals clear. Hearing grossly normal.  NOSE: No deformity or discharge.  MOUTH/THROAT: Lips w/o lesions  RESPIRATORY: Breathing is even, unlabored. Lung sounds are clear   CARDIOVASCULAR: Heart RRR no murmurs, rubs or gallops. No peripheral edema.   GASTROINTESTINAL: Abdomen is soft, non-tender, not distended w/ normal bowel sounds. GENITOURINARY: Bladder non tender, not distended  MUSCULOSKELETAL: No abnormal joints or musculature NEUROLOGIC:  Cranial nerves 2-12 grossly intact. Moves all extremities  PSYCHIATRIC: Mood and affect appropriate to situation, mild dementia, no behavioral issues  Patient Active Problem List   Diagnosis Date Noted  . Intertrochanteric fracture of left hip ([place]) 05/01/2015  . HCAP (healthcare-associated pneumonia) 05/01/2015  . Postoperative anemia due to acute blood loss 05/01/2015  . Dementia without behavioral disturbance 05/01/2015  . Hyperlipidemia 05/01/2015  . Diabetes mellitus without complication ([place])   . Hypertension   . Depression   . H/O: CVA (cerebrovascular accident)   . CAD (coronary artery disease)   . CKD (chronic kidney disease) stage 3, GFR 30-59 ml/min     CBC No results found for: WBC, RBC, HGB, HCT, PLT, MCV, LYMPHSABS, MONOABS, EOSABS, BASOSABS  CMP  No results found for: NA, K, CL, CO2, GLUCOSE, BUN, CREATININE, CALCIUM, PROT, ALBUMIN, AST, ALT, ALKPHOS, BILITOT, GFRNONAA, GFRAA  No results found for: HGBA1C   Patient was never admitted.  Not all labs, radiology exams or other studies done during hospitalization come through on my EPIC note; however they are reviewed by me.    Assessment and Plan  Intertrochanteric fracture of left hip (HCC) SNF - tx with surgical IM nailing; lovenox as prophylaxis  HCAP (healthcare-associated pneumonia) SNF =- developed post-op fever and was felt to  have LLL PNA; was treated with levaquin in hospital and was d/c with 4 more doses to be given in SNF  Postoperative anemia due to acute blood loss SNF - pre-op Hb 12.3, post -op Hb 8.6; cont iron daily and follow up with CBC  CKD (chronic kidney disease) stage 3, GFR 30-59 ml/min SNF - pre-op GFR 48 with Cr 1.76; post -op Cr 1.5; will follow with CBC  Hypertension SNF - controlled on norvasc 10 mg and hydralazine 100 mg BID;cont current meds  H/O: CVA (cerebrovascular accident) SNF - pt was on plavix and ASA prior to hospitalization; plavix was stopped ASA continued and pt put on lovenox as prophlaxis on which he was d/c to SNF  CAD (coronary artery disease) SNF - cont ASa; stable  Diabetes mellitus without complication ([place]) SNF - pt was on glucophage and glipizide prior ;glucophage d/c in hospital  presumably because of pt's Cr; will cont glipizide 10 mg daily amd monitor; pt on statin, ARB was d/c in hosp 2/2 pt cr  Depression Not stated as uncontrolled; cont prozac 10 mg  Dementia without behavioral disturbance SNF pt didn't come with dx but highly suspect  Hyperlipidemia SNF - no stated as uncontrolled; cont pravachol 40 mg daily   Time spent > 45 min;> 50% of time with patient was spent reviewing records, labs, tests and studies, counseling and developing plan of care  Hennie Duos, MD    This encounter was created in error - please disregard.

## 2015-05-01 NOTE — Assessment & Plan Note (Signed)
SNF - pre-op GFR 48 with Cr 1.76; post -op Cr 1.5; will follow with CBC

## 2015-05-01 NOTE — Assessment & Plan Note (Signed)
SNF - controlled on norvasc 10 mg and hydralazine 100 mg BID;cont current meds

## 2015-05-01 NOTE — Assessment & Plan Note (Signed)
Not stated as uncontrolled; cont prozac 10 mg

## 2015-05-01 NOTE — Assessment & Plan Note (Signed)
SNF - pre-op Hb 12.3, post -op Hb 8.6; cont iron daily and follow up with CBC

## 2015-05-01 NOTE — Assessment & Plan Note (Signed)
SNF =- developed post-op fever and was felt to have LLL PNA; was treated with levaquin in hospital and was d/c with 4 more doses to be given in SNF

## 2015-05-01 NOTE — Assessment & Plan Note (Signed)
SNF - no stated as uncontrolled; cont pravachol 40 mg daily

## 2015-05-01 NOTE — Assessment & Plan Note (Signed)
SNF pt didn't come with dx but highly suspect

## 2015-05-01 NOTE — Assessment & Plan Note (Signed)
SNF - cont ASa; stable

## 2015-05-01 NOTE — Progress Notes (Deleted)
This encounter was created in error - please disregard.

## 2015-05-01 NOTE — Assessment & Plan Note (Signed)
SNF - tx with surgical IM nailing; lovenox as prophylaxis

## 2015-05-01 NOTE — Assessment & Plan Note (Signed)
SNF - pt was on glucophage and glipizide prior ;glucophage d/c in hospital  presumably because of pt's Cr; will cont glipizide 10 mg daily amd monitor; pt on statin, ARB was d/c in hosp 2/2 pt cr

## 2015-05-01 NOTE — Assessment & Plan Note (Addendum)
SNF - pt was on plavix and ASA prior to hospitalization; plavix was stopped ASA continued and pt put on lovenox as prophlaxis on which he was d/c to SNF

## 2015-05-03 LAB — HEPATIC FUNCTION PANEL
ALT: 12 U/L (ref 10–40)
AST: 11 U/L — AB (ref 14–40)
Alkaline Phosphatase: 69 U/L (ref 25–125)
BILIRUBIN, TOTAL: 0.6 mg/dL

## 2015-05-04 ENCOUNTER — Encounter: Payer: Self-pay | Admitting: Internal Medicine

## 2015-05-04 DIAGNOSIS — I2510 Atherosclerotic heart disease of native coronary artery without angina pectoris: Secondary | ICD-10-CM | POA: Insufficient documentation

## 2015-05-04 DIAGNOSIS — F329 Major depressive disorder, single episode, unspecified: Secondary | ICD-10-CM | POA: Insufficient documentation

## 2015-05-04 DIAGNOSIS — E785 Hyperlipidemia, unspecified: Secondary | ICD-10-CM | POA: Insufficient documentation

## 2015-05-04 DIAGNOSIS — Z8673 Personal history of transient ischemic attack (TIA), and cerebral infarction without residual deficits: Secondary | ICD-10-CM | POA: Insufficient documentation

## 2015-05-04 DIAGNOSIS — F32A Depression, unspecified: Secondary | ICD-10-CM | POA: Insufficient documentation

## 2015-05-04 DIAGNOSIS — I10 Essential (primary) hypertension: Secondary | ICD-10-CM | POA: Insufficient documentation

## 2015-05-04 DIAGNOSIS — F0390 Unspecified dementia without behavioral disturbance: Secondary | ICD-10-CM | POA: Insufficient documentation

## 2015-05-04 NOTE — Progress Notes (Deleted)
MRN: [id number] Name: Austin Levy  Sex: male Age: 60 y.o. DOB: [birth year]  [place] #:  Facility/Room: Level Of Care: SNF Provider: Inocencio Homes D Emergency Contacts: Extended Emergency Contact Information Primary Emergency Contact: Steed,Cynthia Address: [address], [place] [address] [place] Phone: [phone] Mobile Phone: [phone] Relation: Sister  Code Status:   Allergies: Review of patient's allergies indicates no known allergies.  No chief complaint on file.   HPI: Patient is 60 y.o. male who  Past Medical History  Diagnosis Date  . Hypertension   . Hypercholesteremia   . Coronary artery disease     goes to [place] in [place] every 6 months  . Myocardial infarct [facility]) 2006 & 2007    s/p cardiac cath 2006-  medical intervention  . Non-ischemic cardiomyopathy ([place]) hx 2006-- ef 10-15%    last echo documented 2006 w/ chart  . Stroke [facility]) one yrs ago & one 2008     residual - periphral vision limited  . Diabetes mellitus     oral med  . GERD (gastroesophageal reflux disease)     controlled w/ prilosec    Past Surgical History  Procedure Laterality Date  . Cardiac catheterization  2006 in [place]    results w/ chart (provided by sister)      Medication List       This list is accurate as of: 04/26/15 11:59 PM.  Always use your most recent med list.               amLODipine 10 MG tablet  Commonly known as:  NORVASC  Take 10 mg by mouth every morning.     aspirin 81 MG tablet  Take 81 mg by mouth every morning.     diphenhydrAMINE 25 MG tablet  Commonly known as:  SOMINEX  Take 25 mg by mouth as needed. For hives     famotidine 20 MG tablet  Commonly known as:  PEPCID  Take 20 mg by mouth 2 (two) times daily as needed.     glipiZIDE 10 MG tablet  Commonly known as:  GLUCOTROL  Take 10 mg by mouth 2 (two) times daily before a meal.     lisinopril 20 MG tablet  Commonly known as:   PRINIVIL,ZESTRIL  Take 20 mg by mouth daily.     metoprolol succinate 100 MG 24 hr tablet  Commonly known as:  TOPROL-XL  Take 50 mg by mouth every morning.     omeprazole 20 MG capsule  Commonly known as:  PRILOSEC  Take 20 mg by mouth every morning.     pravastatin 40 MG tablet  Commonly known as:  PRAVACHOL  Take 40 mg by mouth at bedtime.     tolterodine 2 MG 24 hr capsule  Commonly known as:  DETROL LA  Take 2 mg by mouth every morning.        No orders of the defined types were placed in this encounter.     There is no immunization history on file for this patient.  Social History  Substance Use Topics  . Smoking status: Never Smoker   . Smokeless tobacco: Never Used  . Alcohol Use: Yes     Comment: occassionally    Family history is    Review of Systems  DATA OBTAINED: from patient, nurse, medical record, family member GENERAL:  no fevers, fatigue, appetite changes SKIN: No itching,  rash or wounds EYES: No eye pain, redness, discharge EARS: No earache, tinnitus, change in hearing NOSE: No congestion, drainage or bleeding  MOUTH/THROAT: No mouth or tooth pain, No sore throat RESPIRATORY: No cough, wheezing, SOB CARDIAC: No chest pain, palpitations, lower extremity edema  GI: No abdominal pain, No N/V/D or constipation, No heartburn or reflux  GU: No dysuria, frequency or urgency, or incontinence  MUSCULOSKELETAL: No unrelieved bone/joint pain NEUROLOGIC: No headache, dizziness or focal weakness PSYCHIATRIC: No c/o anxiety or sadness   There were no vitals filed for this visit.  SpO2 Readings from Last 1 Encounters:  01/12/12 99%        Physical Exam  GENERAL APPEARANCE: Alert, conversant,  No acute distress.  SKIN: No diaphoresis rash HEAD: Normocephalic, atraumatic  EYES: Conjunctiva/lids clear. Pupils round, reactive. EOMs intact.  EARS: External exam WNL, canals clear. Hearing grossly normal.  NOSE: No deformity or discharge.   MOUTH/THROAT: Lips w/o lesions  RESPIRATORY: Breathing is even, unlabored. Lung sounds are clear   CARDIOVASCULAR: Heart RRR no murmurs, rubs or gallops. No peripheral edema.   GASTROINTESTINAL: Abdomen is soft, non-tender, not distended w/ normal bowel sounds. GENITOURINARY: Bladder non tender, not distended  MUSCULOSKELETAL: No abnormal joints or musculature NEUROLOGIC:  Cranial nerves 2-12 grossly intact. Moves all extremities  PSYCHIATRIC: Mood and affect appropriate to situation, no behavioral issues  Patient Active Problem List   Diagnosis Date Noted  . Intertrochanteric fracture of left hip ([place]) 04/29/2015  . CAP (community acquired pneumonia) 04/29/2015  . Postoperative anemia due to acute blood loss 04/29/2015  . Diabetes mellitus type 2, controlled, with complications ([place]) 0000000  . Chronic kidney disease 04/29/2015  . Gout of big toe 11/25/2013  . Pain, foot 11/25/2013    CBC    Component Value Date/Time   WBC 5.1 01/12/2012 1048   RBC 5.40 01/12/2012 1048   HGB 14.3 01/12/2012 1048   HCT 41.1 01/12/2012 1048   PLT 224 01/12/2012 1048   MCV 76.1* 01/12/2012 1048    CMP     Component Value Date/Time   NA 139 01/12/2012 1048   K 3.5 01/12/2012 1048   CL 98 01/12/2012 1048   CO2 28 01/12/2012 1048   GLUCOSE 183* 01/12/2012 1048   BUN 18 01/12/2012 1048   CREATININE 1.30 01/12/2012 1048   CALCIUM 9.5 01/12/2012 1048   GFRNONAA 60* 01/12/2012 1048   GFRAA 70* 01/12/2012 1048    No results found for: HGBA1C   Ct Head Wo Contrast  01/12/2012  *RADIOLOGY REPORT* Clinical Data: Facial drooping and slurred speech occurring during the night. CT HEAD WITHOUT CONTRAST Technique:  Contiguous axial images were obtained from the base of the skull through the vertex without contrast. Comparison: None. Findings: There is ex vacuo dilation of the left lateral ventricle from an old left external capsule infarct.  The ventricles are normal in configuration otherwise  and, along the sulci, show mild enlargement reflecting mild atrophy.  There are patchy areas of white matter hypoattenuation consistent with mild chronic micro mass ischemic change and several lacunar infarcts are noted in the basal ganglia and thalami. No evidence of a recent infarct. There are no extra-axial masses or abnormal fluid collections. No intracranial hemorrhage. Mucosal thickening lines the ethmoid sinuses and inferior frontal sinus with a small mucous retention cyst in the left maxillary sinus.  Clear mastoid air cells.  No skull fracture. IMPRESSION: No acute intracranial abnormalities. Original Report Authenticated By: Lajean Manes, M.D.    Not all  labs, radiology exams or other studies done during hospitalization come through on my EPIC note; however they are reviewed by me.    Assessment and Plan  No problem-specific assessment & plan notes found for this encounter.   Hennie Duos, MD

## 2015-05-04 NOTE — Progress Notes (Signed)
MRN: [id number] Name: Austin Levy  Sex: male Age: 60 y.o. DOB: [birth year]  [place] #: [facility] Facility/Room:421 Level Of Care: SNF Provider: Inocencio Homes D Emergency Contacts: No emergency contact information on file.  Code Status:   Allergies: Review of patient's allergies indicates no known allergies.  Chief Complaint  Patient presents with  . New Admit To SNF    HPI: Patient is 60 y.o. male with DM, HTN, s/p CVA, CAD who was admitted to [facility] from 2/12-19 fpr repair of L intertrochanteric fracture after a fall from standing. Hospital course was complicated by a post -op PNA and AKI along with acute conjunctivitis. Pt is admitted to SNF with generalized weakness for OT/PT. While at SNF pt will be followed for HTN, tx with norvasc and hydralazine, DM2, tx with glipixide and HLD tx with pravachol.  Past Medical History  Diagnosis Date  . Diabetes mellitus without complication ([place])   . Hypertension   . Depression   . Stroke ([place])   . CAD (coronary artery disease)   . CKD (chronic kidney disease) stage 3, GFR 30-59 ml/min     Past Surgical History  Procedure Laterality Date  . Joint replacement Left 04/2015    HIP      Medication List       This list is accurate as of: 04/26/15 11:59 PM.  Always use your most recent med list.               amLODipine 10 MG tablet  Commonly known as:  NORVASC  Take 10 mg by mouth daily.     aspirin 81 MG tablet  Take 81 mg by mouth daily.     bisacodyl 5 MG EC tablet  Commonly known as:  DULCOLAX  Take 10 mg by mouth daily as needed for moderate constipation.     cholecalciferol 1000 units tablet  Commonly known as:  VITAMIN D  Take 1,000 Units by mouth daily.     ciprofloxacin 0.3 % ophthalmic solution  Commonly known as:  CILOXAN  Place 1 drop into both eyes QID. Administer 1 drop, every 2 hours, while awake, for 2 days. Then 1 drop, every 4 hours, while awake, for the next 5 days.     clopidogrel 75 MG tablet   Commonly known as:  PLAVIX  Take 75 mg by mouth daily.     enoxaparin 30 MG/0.3ML injection  Commonly known as:  LOVENOX  Inject 30 mg into the skin daily.     ferrous sulfate 325 (65 FE) MG tablet  Take 325 mg by mouth daily with breakfast.     FLUoxetine 10 MG capsule  Commonly known as:  PROZAC  Take 10 mg by mouth daily.     glipiZIDE 10 MG tablet  Commonly known as:  GLUCOTROL  Take 10 mg by mouth 2 (two) times daily before a meal.     hydrALAZINE 100 MG tablet  Commonly known as:  APRESOLINE  Take 100 mg by mouth 2 (two) times daily.     levofloxacin 750 MG tablet  Commonly known as:  LEVAQUIN  Take 750 mg by mouth daily. For 4 doses     omeprazole 20 MG capsule  Commonly known as:  PRILOSEC  Take 20 mg by mouth daily.     oxycodone 5 MG capsule  Commonly known as:  OXY-IR  Take 5 mg by mouth every 6 (six) hours as needed.     polyethylene glycol packet  Commonly known as:  MIRALAX / GLYCOLAX  Take 17 g by mouth daily as needed.     pravastatin 40 MG tablet  Commonly known as:  PRAVACHOL  Take 40 mg by mouth daily.        Meds ordered this encounter  Medications  . bisacodyl (DULCOLAX) 5 MG EC tablet    Sig: Take 10 mg by mouth daily as needed for moderate constipation.  . ciprofloxacin (CILOXAN) 0.3 % ophthalmic solution    Sig: Place 1 drop into both eyes QID. Administer 1 drop, every 2 hours, while awake, for 2 days. Then 1 drop, every 4 hours, while awake, for the next 5 days.  Marland Kitchen enoxaparin (LOVENOX) 30 MG/0.3ML injection    Sig: Inject 30 mg into the skin daily.  Marland Kitchen levofloxacin (LEVAQUIN) 750 MG tablet    Sig: Take 750 mg by mouth daily. For 4 doses  . oxycodone (OXY-IR) 5 MG capsule    Sig: Take 5 mg by mouth every 6 (six) hours as needed.  . polyethylene glycol (MIRALAX / GLYCOLAX) packet    Sig: Take 17 g by mouth daily as needed.  Marland Kitchen FLUoxetine (PROZAC) 10 MG capsule    Sig: Take 10 mg by mouth daily.  Marland Kitchen amLODipine (NORVASC) 10 MG tablet     Sig: Take 10 mg by mouth daily.  Marland Kitchen aspirin 81 MG tablet    Sig: Take 81 mg by mouth daily.  . ferrous sulfate 325 (65 FE) MG tablet    Sig: Take 325 mg by mouth daily with breakfast.  . glipiZIDE (GLUCOTROL) 10 MG tablet    Sig: Take 10 mg by mouth 2 (two) times daily before a meal.  . hydrALAZINE (APRESOLINE) 100 MG tablet    Sig: Take 100 mg by mouth 2 (two) times daily.  Marland Kitchen omeprazole (PRILOSEC) 20 MG capsule    Sig: Take 20 mg by mouth daily.  . clopidogrel (PLAVIX) 75 MG tablet    Sig: Take 75 mg by mouth daily.  . pravastatin (PRAVACHOL) 40 MG tablet    Sig: Take 40 mg by mouth daily.  . cholecalciferol (VITAMIN D) 1000 units tablet    Sig: Take 1,000 Units by mouth daily.     There is no immunization history on file for this patient.  Social History  Substance Use Topics  . Smoking status: Never Smoker   . Smokeless tobacco: Never Used  . Alcohol Use: Yes    Family history is UTO, pt can't remember any illness  Review of Systems  DATA OBTAINED: from patient, nurse GENERAL:  no fevers, fatigue, appetite changes SKIN: No itching, rash or wounds EYES: No eye pain, redness, discharge EARS: No earache, tinnitus, change in hearing NOSE: No congestion, drainage or bleeding  MOUTH/THROAT: No mouth or tooth pain, No sore throat RESPIRATORY: No cough, wheezing, SOB CARDIAC: No chest pain, palpitations, lower extremity edema  GI: No abdominal pain, No N/V/D or constipation, No heartburn or reflux  GU: No dysuria, frequency or urgency, or incontinence  MUSCULOSKELETAL: No unrelieved bone/joint pain NEUROLOGIC: No headache, dizziness or focal weakness PSYCHIATRIC: No c/o anxiety or sadness   Filed Vitals:   05/01/15 1355  BP: 145/90  Pulse: 80  Temp: 97.9 F (36.6 C)  Resp: 18    SpO2 Readings from Last 1 Encounters:  No data found for SpO2        Physical Exam  GENERAL APPEARANCE: Alert, mod conversant,  No acute distress.  SKIN: No diaphoresis  rash HEAD: Normocephalic, atraumatic  EYES: Conjunctiva/lids clear. Pupils round, reactive. EOMs intact.  EARS: External exam WNL, canals clear. Hearing grossly normal.  NOSE: No deformity or discharge.  MOUTH/THROAT: Lips w/o lesions  RESPIRATORY: Breathing is even, unlabored. Lung sounds are clear   CARDIOVASCULAR: Heart RRR no murmurs, rubs or gallops. No peripheral edema.   GASTROINTESTINAL: Abdomen is soft, non-tender, not distended w/ normal bowel sounds. GENITOURINARY: Bladder non tender, not distended  MUSCULOSKELETAL: No abnormal joints or musculature NEUROLOGIC:  Cranial nerves 2-12 grossly intact. Moves all extremities  PSYCHIATRIC: Mood and affect appropriate to situation, mild dementia, no behavioral issues  Patient Active Problem List   Diagnosis Date Noted  . Intertrochanteric fracture of left hip ([place]) 05/01/2015  . HCAP (healthcare-associated pneumonia) 05/01/2015  . Postoperative anemia due to acute blood loss 05/01/2015  . Dementia without behavioral disturbance 05/01/2015  . Hyperlipidemia 05/01/2015  . Diabetes mellitus without complication ([place])   . Hypertension   . Depression   . H/O: CVA (cerebrovascular accident)   . CAD (coronary artery disease)   . CKD (chronic kidney disease) stage 3, GFR 30-59 ml/min     CBC No results found for: WBC, RBC, HGB, HCT, PLT, MCV, LYMPHSABS, MONOABS, EOSABS, BASOSABS  CMP  No results found for: NA, K, CL, CO2, GLUCOSE, BUN, CREATININE, CALCIUM, PROT, ALBUMIN, AST, ALT, ALKPHOS, BILITOT, GFRNONAA, GFRAA  No results found for: HGBA1C   Patient was never admitted.  Not all labs, radiology exams or other studies done during hospitalization come through on my EPIC note; however they are reviewed by me.    Assessment and Plan  Intertrochanteric fracture of left hip (HCC) SNF - tx with surgical IM nailing; lovenox as prophylaxis  HCAP (healthcare-associated pneumonia) SNF =- developed post-op fever and was felt to  have LLL PNA; was treated with levaquin in hospital and was d/c with 4 more doses to be given in SNF  Postoperative anemia due to acute blood loss SNF - pre-op Hb 12.3, post -op Hb 8.6; cont iron daily and follow up with CBC  CKD (chronic kidney disease) stage 3, GFR 30-59 ml/min SNF - pre-op GFR 48 with Cr 1.76; post -op Cr 1.5; will follow with CBC  Hypertension SNF - controlled on norvasc 10 mg and hydralazine 100 mg BID;cont current meds  H/O: CVA (cerebrovascular accident) SNF - pt was on plavix and ASA prior to hospitalization; plavix was stopped ASA continued and pt put on lovenox as prophlaxis on which he was d/c to SNF  CAD (coronary artery disease) SNF - cont ASa; stable  Diabetes mellitus without complication ([place]) SNF - pt was on glucophage and glipizide prior ;glucophage d/c in hospital  presumably because of pt's Cr; will cont glipizide 10 mg daily amd monitor; pt on statin, ARB was d/c in hosp 2/2 pt cr  Depression Not stated as uncontrolled; cont prozac 10 mg  Dementia without behavioral disturbance SNF pt didn't come with dx but highly suspect  Hyperlipidemia SNF - no stated as uncontrolled; cont pravachol 40 mg daily   Time spent > 45 min;> 50% of time with patient was spent reviewing records, labs, tests and studies, counseling and developing plan of care  Hennie Duos, MD

## 2015-05-13 ENCOUNTER — Encounter: Payer: Self-pay | Admitting: Internal Medicine

## 2015-05-13 ENCOUNTER — Non-Acute Institutional Stay (SKILLED_NURSING_FACILITY): Payer: Medicaid Other | Admitting: Internal Medicine

## 2015-05-13 DIAGNOSIS — D509 Iron deficiency anemia, unspecified: Secondary | ICD-10-CM | POA: Diagnosis not present

## 2015-05-13 DIAGNOSIS — R05 Cough: Secondary | ICD-10-CM

## 2015-05-13 DIAGNOSIS — N289 Disorder of kidney and ureter, unspecified: Secondary | ICD-10-CM | POA: Diagnosis not present

## 2015-05-13 DIAGNOSIS — R059 Cough, unspecified: Secondary | ICD-10-CM | POA: Insufficient documentation

## 2015-05-13 NOTE — Progress Notes (Signed)
Patient ID: Austin Levy, male   DOB: 1956/03/04, 60 y.o.   MRN: [id number]     This is an acute visit.  Level care skilled.  [facility].  Chief complaint acute visit follow-up coughing episode during therapy History of present illness.  Patient is a 60 year old male who presented to the ED after a fall apparently he lost his balance and fell on his right hip and sustained an acute comminuted fracture of the left hip with varus angulation.  He had a status post IM nailing of the left hip-postop course was fairly unremarkable and appears he is here for essentially rehabilitation he has been discharged on Lovenox for DVT prophylaxis.  Family also had a fever in the hospital that was thought possibly left lower lobe pneumonia he has finished a course of Levaquin.  Anemia was thought most likely postop hemoglobin was stable. Most recently 8.5 on February 27   Apparently patient had a coughing spell therapy today however there has not really been any recurrence it appears apparently does have a cough at times but this is not grossly changed from baseline he is not really complaining of any shortness of breath vital signs are stable oxygen saturation is in the 90s on room air      Previous medical history.  History of left hip femur fracture.  Pneumonia.  Leukocytosis.  Acute kidney injury.  Fever.  Conjunctivitis.  History of stroke.  Depression.  Diabetes type 2.  Coronary artery disease.  Hypertension.  Chronic kidney disease stage III.  Medications.    Lovenox 30 mg daily.    Code on 5 mg every 6 hours when necessary.   Prozac 10 mg daily.  Norvasc 10 mg daily.  Aspirin 81 mg daily.  Ferrous sulfate 325 mg daily.  Glipizide 10 mg twice a day.  Hydralazine 100 mg twice a day.  Prilosec 20 mg daily.  Plavix 75 mg daily a.m.  Pravastatin 40 mg daily.  Vitamin D3 thousand units every morning.   Family medical social history  reviewed per discharge summary on 04/25/2015.  Review of systems.--Obtain from patient and nursing-patient does not speak much so somewhat limited  In general does not complain of fever or chills.  Skin does not complain of rashes or itching.  Eyes no visual changes.   nose mouth and throat does not complaining of sore throat or nasal discharge.  Respiratory does not complain of shortness of breath -cough during  therapy as noted above Cardiac no chest pain.  GI is not complaining of nausea or vomiting abdominal discomfort or GI pain.  GU does not complain of dysuria.  Muscle skeletal apparently hip pain appears to be under control  Neurologic did not complain of numbness or dizziness.  It psych does not history depression does not complaining of overt depression or anxiety currently apparently there issome history this.  Physical exam.  Temperature 97.9 pulse 75 respirations 19 blood pressure 141/90 O2 saturation 97% on room air  In general this is a pleasant middle-aged male in no distress.  His skin is warm and dry   Eyes pupils appear reactive light visual acuity appears grossly intact.  Oropharynx is clear he is largely edentulous mucous membranes moist.  Chest is clear to auscultation there is no labored breathing.  Heart is regular rate and rhythm without murmur gallop or rub-- there is no significant lower extremity edema. Or coccyx edema  Abdomen is protuberant soft nontender with positive bowel sounds.  Muscle skeletal  he does ambulate a wheelchair I did not note any deformities strength appears to be intact upper extremities lower extremity somewhat difficult to assess but he is able to move lower extremities    Neurologic grossly intact there were no lateralizing findings.  Psyche is oriented to self is pleasant. But speaks fairly minimally  Labs.  05/03/2015.  WBC 6.3 hemoglobin 8.5 platelets 442.  Sodium 137 potassium 4.2 BUN 20 creatinine 1.55  CO2 level was 23  04/19/2015.  WBC 5.0 hemoglobin 8.6 platelets 256.  Sodium 139 potassium 3.9.  Liver function tests within normal limits.  Assessment and plan.  History of cough-this appears to be intermittent he is status post treatment for pneumonia-we will recheck a chest x-ray two-view-also will start Mucinex 600 twice a day for 5 days and monitor vital signs pulse ox for 72 hours every shift.  Clinically he appears stable I do not see clinically an acute respiratory issue here but this will have to be watched  .  #2 history of anemia thought to be postop-this will have to be watched will update a CBC as well as recent hemoglobin 8.5 in February 27.--He is on iron  #3 history renal insufficiency-most recent creatinine 1.55 of February 27 we will update this as well  [id number]                .

## 2015-05-19 LAB — CBC AND DIFFERENTIAL
HCT: 33 % — AB (ref 41–53)
HEMOGLOBIN: 10.4 g/dL — AB (ref 13.5–17.5)
Platelets: 256 10*3/uL (ref 150–399)
WBC: 3.6 10*3/mL

## 2015-05-28 LAB — BASIC METABOLIC PANEL
BUN: 21 mg/dL (ref 4–21)
Creatinine: 1.6 mg/dL — AB (ref <=1.3)
Glucose: 219 mg/dL
Potassium: 4.3 mmol/L (ref 3.4–5.3)
Sodium: 139 mmol/L (ref 137–147)

## 2015-06-07 ENCOUNTER — Encounter: Payer: Self-pay | Admitting: Internal Medicine

## 2015-06-07 ENCOUNTER — Non-Acute Institutional Stay (SKILLED_NURSING_FACILITY): Payer: Medicaid Other | Admitting: Internal Medicine

## 2015-06-07 DIAGNOSIS — S72142D Displaced intertrochanteric fracture of left femur, subsequent encounter for closed fracture with routine healing: Secondary | ICD-10-CM

## 2015-06-07 DIAGNOSIS — N183 Chronic kidney disease, stage 3 unspecified: Secondary | ICD-10-CM

## 2015-06-07 DIAGNOSIS — I10 Essential (primary) hypertension: Secondary | ICD-10-CM

## 2015-06-07 DIAGNOSIS — E118 Type 2 diabetes mellitus with unspecified complications: Secondary | ICD-10-CM | POA: Diagnosis not present

## 2015-06-07 NOTE — Progress Notes (Signed)
Location:  [place] Room Number: 421-D Place of Service:  SNF (31)  Provider:  PCP: Benito Mccreedy, MD Patient Care Team: Benito Mccreedy, MD as PCP - General (Internal Medicine)  Extended Emergency Contact Information Primary Emergency Contact: Steed,Cynthia Address: [address], [place] [address] [place] Phone: [phone] Mobile Phone: [phone] Relation: Sister  Code Status: FullCode  Goals of care:  Advanced Directive information Advanced Directives 06/07/2015  Does patient have an advance directive? No  Would patient like information on creating an advanced directive? No - patient declined information     No Known Allergies  Chief Complaint  Patient presents with  . Discharge Note    HPI:  60 y.o. male  with a history of left hip fracture status post repair-as well as a history of pneumonia and postop anemia acute chronic kidney disease among other issues.  He was here for rehabilitation after hospitalization for the left hip fracture that was surgically repaired.  Postop he did develop a fever and was felt to have left lower lobe pneumonia treated with Levaquin.  He also is postop anemia which was down to 8.6 this has risen recent lab of 10.4 on March 14.  Patient has done relatively well with his rehabilitation he has gained strength he is now walking relatively well with walker-he will have a personal care assistant at home at times which I feel is certainly warranted he also will need a rolling walker to assist with ambulation.  Nursing staff does not report any recent acute issues patient has no complaints today vital signs of an stable.  He is a type II diabetic CBGs have been stable 100s he is on glipizide 10 mg a day.        Past Medical History  Diagnosis Date  . Hypertension   . Hypercholesteremia   . Coronary artery disease     goes to [place] in  [place] every 6 months  . Myocardial infarct [facility]) 2006 & 2007    s/p cardiac cath 2006-  medical intervention  . Non-ischemic cardiomyopathy ([place]) hx 2006-- ef 10-15%    last echo documented 2006 w/ chart  . Stroke [facility]) one yrs ago & one 2008     residual - periphral vision limited  . Diabetes mellitus     oral med  . GERD (gastroesophageal reflux disease)     controlled w/ prilosec    Past Surgical History  Procedure Laterality Date  . Cardiac catheterization  2006 in [place]    results w/ chart (provided by sister)      reports that he has never smoked. He has never used smokeless tobacco. He reports that he drinks alcohol. He reports that he does not use illicit drugs. Social History   Social History  . Marital Status: Divorced    Spouse Name: N/A  . Number of Children: N/A  . Years of Education: N/A   Occupational History  . Not on file.   Social History Main Topics  . Smoking status: Never Smoker   . Smokeless tobacco: Never Used  . Alcohol Use: Yes     Comment: occassionally  . Drug Use: No  . Sexual Activity: Not on file   Other Topics Concern  . Not on file   Social History Narrative   Functional Status Survey:    No Known Allergies  Pertinent  Health Maintenance  Due  Topic Date Due  . HEMOGLOBIN A1C  10-Mar-1955  . FOOT EXAM  06/27/1966  . OPHTHALMOLOGY EXAM  06/27/1966  . URINE MICROALBUMIN  06/27/1966  . COLONOSCOPY  06/27/2006  . INFLUENZA VACCINE  10/05/2015    Medications:   Medication List       This list is accurate as of: 06/07/15  9:43 AM.  Always use your most recent med list.               amLODipine 10 MG tablet  Commonly known as:  NORVASC  Take 10 mg by mouth every morning.     aspirin 81 MG tablet  Take 81 mg by mouth every morning.     cholecalciferol 1000 units tablet  Commonly known as:  VITAMIN D  Take 1,000 Units by mouth daily.     ferrous sulfate 325 (65 FE) MG tablet  Take 325 mg by mouth daily  with breakfast.     FLUoxetine 10 MG capsule  Commonly known as:  PROZAC  Take 10 mg by mouth daily.     glipiZIDE 10 MG tablet  Commonly known as:  GLUCOTROL  Take 10 mg by mouth 2 (two) times daily before a meal.     hydrALAZINE 100 MG tablet  Commonly known as:  APRESOLINE  Take 100 mg by mouth 2 (two) times daily.     omeprazole 20 MG capsule  Commonly known as:  PRILOSEC  Take 20 mg by mouth every morning.     pravastatin 40 MG tablet  Commonly known as:  PRAVACHOL  Take 40 mg by mouth at bedtime.        Review of Systems   In general does not complaining of any fever or chills says he feels well.  Skin does not complain of rashes or itching surgical site appears to be well-healed on left hip.  Head ears eyes nose mouth and throat no complaints of visual changes or sore throat.  Respiratory denies any cough or shortness of breath.  Cardiac no chest pain no significant lower extremity edema.  GI no complaints of abdominal discomfort nausea vomiting diarrhea constipation.  Musculoskeletal again has gained strength is now ambulating with a walker does not complain of significant joint pain or hip pain.  Neurologic is not complaining of dizziness headache or numbness.  Psych does have a history of depression this appears well controlled     Filed Vitals:   06/07/15 0915  BP: 117/60  Pulse: 75  Temp: 97.2 F (36.2 C)  TempSrc: Oral  Resp: 20  Height: 6' (1.829 m)  Weight: 151 lb 12.8 oz (68.856 kg)   Body mass index is 20.58 kg/(m^2). Physical Exam  In general this is a pleasant middle-aged male in no distress sitting comfortably in his wheelchair.  His skin is warm and dry.  Eyes pupils appear reactive light visual acuity appears grossly intact sclera and conjunctivae are clear X Trussell movements intact.  Chest is clear to auscultation there is no labored breathing.  Heart is regular rate and rhythm without murmur gallop or rub he does not have  significant lower extremity edema.  Abdomen soft nontender with positive bowel sounds.  Muscle skeletal is ambulatory is able to stand without assistance and uses [facility] doing relatively well with this.  Neurologic is grossly intact speech is clear he does does not speak much but is more verbal than when I seen him previously.  Psych he is grossly alert and oriented although  he does not speak a whole lot there is some question of history of mild dementia apparently but he has functioned well in this facility nursing staff has not reported any issues.   Labs reviewed: Basic Metabolic Panel:  Recent Labs  05/28/15  NA 139  K 4.3  BUN 21  CREATININE 1.6*   Liver Function Tests:  Recent Labs  05/03/15  AST 11*  ALT 12  ALKPHOS 69   No results for input(s): LIPASE, AMYLASE in the last 8760 hours. No results for input(s): AMMONIA in the last 8760 hours. CBC:  Recent Labs  05/19/15  WBC 3.6  HGB 10.4*  HCT 33*  PLT 256    Assessment and plan.  #1- history of left hip fracture with repair he appears to have done well with this--no ambulate fairly well with a rolling walker he was on Lovenox as prophylaxis now he is on low-dose aspirin 81 mg a day.--Will need expedient follow-up by primary care  provider  History of healthcare associated pneumonia this appears to have resolved unremarkably.  #3 history of postop anemia hemoglobin has risen up to 10.5 on lab done possibly 2 weeks ago this will need follow-up by primary care provider--he is on iron.  #4 history of chronic kidney disease baseline creatinine ears to be around 1.6-this appears to be stable will need again follow up by primary care provider.  #5 hypertension this appears controlled on Norvasc 10 mg a day and hydralazine 100 mg twice a day recent blood pressures 117/60-124/79.  History of CVA--he does continue on aspirin as well as a statin-at one point also had been on Plavix will defer to primary care  provider whether to restart this.  #7 history coronary artery disease-this appears to be stable asymptomatic on aspirin.  #8 history diabetes type 2-this appears stable with CBGs largely in the 100s he is on glipizide twice a day.  #9 history depression continues on Prozac this appears to be stable this is not really been an issue during his stay here he does not talk a whole lot but apparently this is not new-she is pleasant and cooperative with staff depression does not appear to have been a problem during his stay here.  #10 [place] history of dementia this will warrant follow up by primary care provider --will have a personal care assistant at home  At times-- he feels confident he will be able to do well at home although this will have to be watched.  #11 history hyperlipidemia he is on a statin again will warrant follow up by primary care provider was not aggressive pursuing a lipid panel here since his stay here was relatively short      Patient is being discharged with the following home health services:  Personal care assistant  Patient is being discharged with the following durable medical equipment: Rolling walker   Patient has been advised to f/u with their PCP in 1-2 weeks to bring them up to date on their rehab stay.  Social services at facility was responsible for arranging this appointment.  Pt was provided with a 30 day supply of prescriptions for medications and refills must be obtained from their PCP.  For controlled substances, a more limited supply may be provided adequate until PCP appointment only.  [id number] note greater than 30 minutes spent on this discharge summary-greater than 50% of time spent coordinating plan of care for numerous diagnoses-prescriptions have been written

## 2015-06-09 ENCOUNTER — Encounter: Payer: Self-pay | Admitting: Internal Medicine

## 2015-06-09 NOTE — Progress Notes (Signed)
MRN: [id number] Name: Austin Levy  Sex: male Age: 60 y.o. DOB: 01-15-1956  [place] #:  Facility/Room: Level Of Care: SNF Provider: Inocencio Homes D Emergency Contacts: No emergency contact information on file.  Code Status:   Allergies: Review of patient's allergies indicates no known allergies.  Chief Complaint  Patient presents with  . New Admit To SNF  . Error    HPI: Patient is 60 y.o. male who   Past Medical History  Diagnosis Date  . Diabetes mellitus without complication ([place])   . Hypertension   . Depression   . Stroke ([place])   . CAD (coronary artery disease)   . CKD (chronic kidney disease) stage 3, GFR 30-59 ml/min     Past Surgical History  Procedure Laterality Date  . Joint replacement Left 04/2015    HIP      Medication List       This list is accurate as of: 05/01/15 11:59 PM.  Always use your most recent med list.               amLODipine 10 MG tablet  Commonly known as:  NORVASC  Take 10 mg by mouth daily.     aspirin 81 MG tablet  Take 81 mg by mouth daily.     bisacodyl 5 MG EC tablet  Commonly known as:  DULCOLAX  Take 10 mg by mouth daily as needed for moderate constipation.     cholecalciferol 1000 units tablet  Commonly known as:  VITAMIN D  Take 1,000 Units by mouth daily.     ciprofloxacin 0.3 % ophthalmic solution  Commonly known as:  CILOXAN  Place 1 drop into both eyes QID. Administer 1 drop, every 2 hours, while awake, for 2 days. Then 1 drop, every 4 hours, while awake, for the next 5 days.     clopidogrel 75 MG tablet  Commonly known as:  PLAVIX  Take 75 mg by mouth daily.     enoxaparin 30 MG/0.3ML injection  Commonly known as:  LOVENOX  Inject 30 mg into the skin daily.     ferrous sulfate 325 (65 FE) MG tablet  Take 325 mg by mouth daily with breakfast.     FLUoxetine 10 MG capsule  Commonly known as:  PROZAC  Take 10 mg by mouth daily.     glipiZIDE 10 MG tablet  Commonly known as:  GLUCOTROL  Take 10 mg  by mouth 2 (two) times daily before a meal.     hydrALAZINE 100 MG tablet  Commonly known as:  APRESOLINE  Take 100 mg by mouth 2 (two) times daily.     levofloxacin 750 MG tablet  Commonly known as:  LEVAQUIN  Take 750 mg by mouth daily. For 4 doses     omeprazole 20 MG capsule  Commonly known as:  PRILOSEC  Take 20 mg by mouth daily.     oxycodone 5 MG capsule  Commonly known as:  OXY-IR  Take 5 mg by mouth every 6 (six) hours as needed.     polyethylene glycol packet  Commonly known as:  MIRALAX / GLYCOLAX  Take 17 g by mouth daily as needed.     pravastatin 40 MG tablet  Commonly known as:  PRAVACHOL  Take 40 mg by mouth daily.        No orders of the defined types were placed in this encounter.     There is no immunization history on file for this patient.  Social  History  Substance Use Topics  . Smoking status: Never Smoker   . Smokeless tobacco: Never Used  . Alcohol Use: Yes    Review of Systems  DATA OBTAINED: from patient, nurse, medical record, family member GENERAL:  no fevers, fatigue, appetite changes SKIN: No itching, rash HEENT: No complaint RESPIRATORY: No cough, wheezing, SOB CARDIAC: No chest pain, palpitations, lower extremity edema  GI: No abdominal pain, No N/V/D or constipation, No heartburn or reflux  GU: No dysuria, frequency or urgency, or incontinence  MUSCULOSKELETAL: No unrelieved bone/joint pain NEUROLOGIC: No headache, dizziness  PSYCHIATRIC: No overt anxiety or sadness  Filed Vitals:   05/01/15 1349  BP: 145/90  Pulse: 80  Temp: 97.9 F (36.6 C)  Resp: 18    Physical Exam  GENERAL APPEARANCE: Alert, conversant, No acute distress  SKIN: No diaphoresis rash, or wounds HEENT: Unremarkable RESPIRATORY: Breathing is even, unlabored. Lung sounds are clear   CARDIOVASCULAR: Heart RRR no murmurs, rubs or gallops. No peripheral edema  GASTROINTESTINAL: Abdomen is soft, non-tender, not distended w/ normal bowel sounds.   GENITOURINARY: Bladder non tender, not distended  MUSCULOSKELETAL: No abnormal joints or musculature NEUROLOGIC: Cranial nerves 2-12 grossly intact. Moves all extremities PSYCHIATRIC: Mood and affect appropriate to situation, no behavioral issues  Patient Active Problem List   Diagnosis Date Noted  . Intertrochanteric fracture of left hip ([place]) 05/01/2015  . HCAP (healthcare-associated pneumonia) 05/01/2015  . Postoperative anemia due to acute blood loss 05/01/2015  . Dementia without behavioral disturbance 05/01/2015  . Hyperlipidemia 05/01/2015  . Diabetes mellitus without complication ([place])   . Hypertension   . Depression   . H/O: CVA (cerebrovascular accident)   . CAD (coronary artery disease)   . CKD (chronic kidney disease) stage 3, GFR 30-59 ml/min     CBC No results found for: WBC, RBC, HGB, HCT, PLT, MCV, LYMPHSABS, MONOABS, EOSABS, BASOSABS  CMP  No results found for: NA, K, CL, CO2, GLUCOSE, BUN, CREATININE, CALCIUM, PROT, ALBUMIN, AST, ALT, ALKPHOS, BILITOT, GFRNONAA, GFRAA  Assessment and Plan  No problem-specific assessment & plan notes found for this encounter.   Hennie Duos, MD

## 2015-06-09 NOTE — Progress Notes (Signed)
Patient ID: Austin Levy, male   DOB: 04-27-1955, 60 y.o.   MRN: [id number]

## 2015-06-09 NOTE — Progress Notes (Signed)
MRN: [id number] Name: Austin Levy  Sex: male Age: 60 y.o.DOB: Jun 02, 1955  [place] #[facility] Facility/Room:421 Level Of Care: SNF Provider: Inocencio Homes D Emergency Contacts: No emergency contact information on file.  Code Status:   Allergies: Review of patient's allergies indicates no known allergies.  Chief Complaint  Patient presents with  . New Admit To SNF    HPI: Patient is 60 y.o. male with DM, HTN, s/p CVA, CAD who was admitted to [facility] from 2/12-19 fpr repair of L intertrochanteric fracture after a fall from standing. Hospital course was complicated by a post -op PNA and AKI along with acute conjunctivitis. Pt is admitted to SNF with generalized weakness for OT/PT. While at SNF pt will be followed for HTN, tx with norvasc and hydralazine, M2, tx with glipixide and HLD tx with pravachol.  Past Medical History  Diagnosis Date  . Diabetes mellitus without complication ([place])   . Hypertension   . Depression   . Stroke ([place])   . CAD (coronary artery disease)   . CKD (chronic kidney disease) stage 3, GFR 30-59 ml/min     Past Surgical History  Procedure Laterality Date  . Joint replacement Left 04/2015    HIP      Medication List       This list is accurate as of: 04/26/15 11:59 PM.  Always use your most recent med list.               amLODipine 10 MG tablet  Commonly known as:  NORVASC  Take 10 mg by mouth daily.     aspirin 81 MG tablet  Take 81 mg by mouth daily.     bisacodyl 5 MG EC tablet  Commonly known as:  DULCOLAX  Take 10 mg by mouth daily as needed for moderate constipation.     cholecalciferol 1000 units tablet  Commonly known as:  VITAMIN D  Take 1,000 Units by mouth daily.     ciprofloxacin 0.3 % ophthalmic solution  Commonly known as:  CILOXAN  Place 1 drop into both eyes QID. Administer 1 drop, every 2 hours, while awake, for 2 days. Then 1 drop, every 4 hours, while awake, for the next 5 days.     clopidogrel 75 MG tablet   Commonly known as:  PLAVIX  Take 75 mg by mouth daily.     enoxaparin 30 MG/0.3ML injection  Commonly known as:  LOVENOX  Inject 30 mg into the skin daily.     ferrous sulfate 325 (65 FE) MG tablet  Take 325 mg by mouth daily with breakfast.     FLUoxetine 10 MG capsule  Commonly known as:  PROZAC  Take 10 mg by mouth daily.     glipiZIDE 10 MG tablet  Commonly known as:  GLUCOTROL  Take 10 mg by mouth 2 (two) times daily before a meal.     hydrALAZINE 100 MG tablet  Commonly known as:  APRESOLINE  Take 100 mg by mouth 2 (two) times daily.     levofloxacin 750 MG tablet  Commonly known as:  LEVAQUIN  Take 750 mg by mouth daily. For 4 doses     omeprazole 20 MG capsule  Commonly known as:  PRILOSEC  Take 20 mg by mouth daily.     oxycodone 5 MG capsule  Commonly known as:  OXY-IR  Take 5 mg by mouth every 6 (six) hours as needed.     polyethylene glycol packet  Commonly known as:  MIRALAX / GLYCOLAX  Take 17 g by mouth daily as needed.     pravastatin 40 MG tablet  Commonly known as:  PRAVACHOL  Take 40 mg by mouth daily.        Meds ordered this encounter  Medications  . bisacodyl (DULCOLAX) 5 MG EC tablet    Sig: Take 10 mg by mouth daily as needed for moderate constipation.  . ciprofloxacin (CILOXAN) 0.3 % ophthalmic solution    Sig: Place 1 drop into both eyes QID. Administer 1 drop, every 2 hours, while awake, for 2 days. Then 1 drop, every 4 hours, while awake, for the next 5 days.  Marland Kitchen enoxaparin (LOVENOX) 30 MG/0.3ML injection    Sig: Inject 30 mg into the skin daily.  Marland Kitchen levofloxacin (LEVAQUIN) 750 MG tablet    Sig: Take 750 mg by mouth daily. For 4 doses  . oxycodone (OXY-IR) 5 MG capsule    Sig: Take 5 mg by mouth every 6 (six) hours as needed.  . polyethylene glycol (MIRALAX / GLYCOLAX) packet    Sig: Take 17 g by mouth daily as needed.  Marland Kitchen FLUoxetine (PROZAC) 10 MG capsule    Sig: Take 10 mg by mouth daily.  Marland Kitchen amLODipine (NORVASC) 10 MG tablet     Sig: Take 10 mg by mouth daily.  Marland Kitchen aspirin 81 MG tablet    Sig: Take 81 mg by mouth daily.  . ferrous sulfate 325 (65 FE) MG tablet    Sig: Take 325 mg by mouth daily with breakfast.  . glipiZIDE (GLUCOTROL) 10 MG tablet    Sig: Take 10 mg by mouth 2 (two) times daily before a meal.  . hydrALAZINE (APRESOLINE) 100 MG tablet    Sig: Take 100 mg by mouth 2 (two) times daily.  Marland Kitchen omeprazole (PRILOSEC) 20 MG capsule    Sig: Take 20 mg by mouth daily.  . clopidogrel (PLAVIX) 75 MG tablet    Sig: Take 75 mg by mouth daily.  . pravastatin (PRAVACHOL) 40 MG tablet    Sig: Take 40 mg by mouth daily.  . cholecalciferol (VITAMIN D) 1000 units tablet    Sig: Take 1,000 Units by mouth daily.     There is no immunization history on file for this patient.  Social History  Substance Use Topics  . Smoking status: Never Smoker   . Smokeless tobacco: Never Used  . Alcohol Use: Yes    Family history is UTO, pt can't remember any illness  Review of Systems  DATA OBTAINED: from patient, nurse GENERAL:  no fevers, fatigue, appetite changes SKIN: No itching, rash or wounds EYES: No eye pain, redness, discharge EARS: No earache, tinnitus, change in hearing NOSE: No congestion, drainage or bleeding  MOUTH/THROAT: No mouth or tooth pain, No sore throat RESPIRATORY: No cough, wheezing, SOB CARDIAC: No chest pain, palpitations, lower extremity edema  GI: No abdominal pain, No N/V/D or constipation, No heartburn or reflux  GU: No dysuria, frequency or urgency, or incontinence  MUSCULOSKELETAL: No unrelieved bone/joint pain NEUROLOGIC: No headache, dizziness or focal weakness PSYCHIATRIC: No c/o anxiety or sadness   Filed Vitals:   05/01/15 1355  BP: 145/90  Pulse: 80  Temp: 97.9 F (36.6 C)  Resp: 18    SpO2 Readings from Last 1 Encounters:  No data found for SpO2        Physical Exam  GENERAL APPEARANCE: Alert, mod conversant,  No acute distress.  SKIN: No diaphoresis  rash HEAD: Normocephalic, atraumatic  EYES: Conjunctiva/lids clear. Pupils round, reactive. EOMs intact.  EARS: External exam WNL, canals clear. Hearing grossly normal.  NOSE: No deformity or discharge.  MOUTH/THROAT: Lips w/o lesions  RESPIRATORY: Breathing is even, unlabored. Lung sounds are clear   CARDIOVASCULAR: Heart RRR no murmurs, rubs or gallops. No peripheral edema.   GASTROINTESTINAL: Abdomen is soft, non-tender, not distended w/ normal bowel sounds. GENITOURINARY: Bladder non tender, not distended  MUSCULOSKELETAL: No abnormal joints or musculature NEUROLOGIC:  Cranial nerves 2-12 grossly intact. Moves all extremities  PSYCHIATRIC: Mood and affect appropriate to situation, mild dementia, no behavioral issues  Patient Active Problem List   Diagnosis Date Noted  . Intertrochanteric fracture of left hip ([place]) 05/01/2015  . HCAP (healthcare-associated pneumonia) 05/01/2015  . Postoperative anemia due to acute blood loss 05/01/2015  . Dementia without behavioral disturbance 05/01/2015  . Hyperlipidemia 05/01/2015  . Diabetes mellitus without complication ([place])   . Hypertension   . Depression   . H/O: CVA (cerebrovascular accident)   . CAD (coronary artery disease)   . CKD (chronic kidney disease) stage 3, GFR 30-59 ml/min     CBC No results found for: WBC, RBC, HGB, HCT, PLT, MCV, LYMPHSABS, MONOABS, EOSABS, BASOSABS  CMP  No results found for: NA, K, CL, CO2, GLUCOSE, BUN, CREATININE, CALCIUM, PROT, ALBUMIN, AST, ALT, ALKPHOS, BILITOT, GFRNONAA, GFRAA  No results found for: HGBA1C   Patient was never admitted.  Not all labs, radiology exams or other studies done during hospitalization come through on my EPIC note; however they are reviewed by me.    Assessment and PlanIntertrochanteric fracture of left hip (HCC) SNF - tx with surgical IM nailing; lovenox as prophylaxis  HCAP (healthcare-associated pneumonia) SNF =- developed post-op fever and was felt to have  LLL PNA; was treated with levaquin in hospital and was d/c with 4 more doses to be given in SNF  Postoperative anemia due to acute blood loss SNF - pre-op Hb 12.3, post -op Hb 8.6; cont iron daily and follow up with CBC  CKD (chronic kidney disease) stage 3, GFR 30-59 ml/min SNF - pre-op GFR 48 with Cr 1.76; post -op Cr 1.5; will follow with CBC  Hypertension SNF - controlled on norvasc 10 mg and hydralazine 100 mg BID;cont current meds  H/O: CVA (cerebrovascular accident) SNF - pt was on plavix and ASA prior to hospitalization; plavix was stopped ASA continued and pt put on lovenox as prophlaxis on which he was d/c to SNF  CAD (coronary artery disease) SNF - cont ASa; stable  Diabetes mellitus without complication ([place]) SNF - pt was on glucophage and glipizide prior ;glucophage d/c in hospital  presumably because of pt's Cr; will cont glipizide 10 mg daily amd monitor; pt on statin, ARB was d/c in hosp 2/2 pt cr  Depression Not stated as uncontrolled; cont prozac 10 mg  Dementia without behavioral disturbance SNF pt didn't come with dx but highly suspect  Hyperlipidemia SNF - no stated as uncontrolled; cont pravachol 40 mg daily  Time spent . 45 min;> 50% of time with patient was spent reviewing records, labs, tests and studies, counseling and developing plan of care  Hennie Duos MD

## 2015-06-30 ENCOUNTER — Encounter: Payer: Self-pay | Admitting: Internal Medicine

## 2017-08-02 ENCOUNTER — Ambulatory Visit: Payer: Medicaid Other | Admitting: Podiatry

## 2017-08-02 ENCOUNTER — Encounter: Payer: Self-pay | Admitting: Podiatry

## 2017-08-02 VITALS — BP 169/82 | HR 77 | Ht 71.0 in | Wt 177.0 lb

## 2017-08-02 DIAGNOSIS — M79671 Pain in right foot: Secondary | ICD-10-CM | POA: Diagnosis not present

## 2017-08-02 DIAGNOSIS — M79672 Pain in left foot: Secondary | ICD-10-CM | POA: Diagnosis not present

## 2017-08-02 DIAGNOSIS — L600 Ingrowing nail: Secondary | ICD-10-CM

## 2017-08-02 NOTE — Progress Notes (Signed)
SUBJECTIVE: 62 y.o. year old male accompanied by his sister for ingrown nail problem on right great toe nail lateral border x 2 weeks.  He usually walks with cane but came in with walker due to a fall he took two weeks ago.  Review of Systems  Constitutional: Negative.   HENT: Negative.   Eyes: Negative.   Respiratory: Negative.   Cardiovascular: Negative.   Gastrointestinal: Negative.   Genitourinary: Negative.   Musculoskeletal: Positive for joint pain. Negative for back pain and neck pain.       Right knee arthritic joint.  Skin: Negative.   Neurological:       Short term memory, diagnosed with Dementia.     OBJECTIVE: DERMATOLOGIC EXAMINATION: Ingrown hallucal nail with dry old blood lateral border. No inflammation noted. No drainage noted.  VASCULAR EXAMINATION OF LOWER LIMBS: All pedal pulses are palpable with normal pulsation.  Mild forefoot edema present on right foot. Temperature gradient from tibial crest to dorsum of foot is within normal bilateral.  NEUROLOGIC EXAMINATION OF THE LOWER LIMBS: All epicritic and tactile sensations grossly intact.  MUSCULOSKELETAL EXAMINATION: No gross deformities noted.  ASSESSMENT: Ingrown hallucal nails without infection. Painful nails.  PLAN: Reviewed findings and available treatment options. We will debride today. If problem continues, return for matrixectomy.

## 2017-08-02 NOTE — Patient Instructions (Signed)
Seen for hypertrophic ingrown nails. No active infection noted. All nails debrided. Return in 3 months or sooner if needed.

## 2017-10-08 ENCOUNTER — Ambulatory Visit: Payer: Self-pay | Admitting: Podiatry

## 2018-08-06 ENCOUNTER — Ambulatory Visit: Payer: Medicaid Other | Admitting: Speech Pathology

## 2018-08-06 ENCOUNTER — Other Ambulatory Visit: Payer: Self-pay

## 2018-08-06 NOTE — Therapy (Signed)
Visit Arrived- No Charge.  Pt/sister arrived for SLP evaluation which was cancelled by phone yesterday. SLP brought pt and sister back into office to explain that given MD concerns for aspiration, objective swallowing evaluation is needed prior to beginning speech therapy. Will request orders for Modified Barium Swallow Study from referring provider.  Deneise Lever, [place], [place] [address] [place], [place], [address] Phone: [phone]   Fax:  [phone]  Patient Details  Name: Austin Levy MRN: [id number] Date of Birth: [birth year] Referring Provider:  Benito Mccreedy, MD  Encounter Date: 08/06/2018   Aliene Altes 08/06/2018, 10:21 AM  [facility] [address] [place], [place], [address] Phone: [phone]   Fax:  [phone]

## 2018-09-13 ENCOUNTER — Other Ambulatory Visit (HOSPITAL_COMMUNITY): Payer: Self-pay

## 2018-09-13 DIAGNOSIS — R1310 Dysphagia, unspecified: Secondary | ICD-10-CM

## 2018-09-19 ENCOUNTER — Ambulatory Visit (HOSPITAL_COMMUNITY)
Admission: RE | Admit: 2018-09-19 | Discharge: 2018-09-19 | Disposition: A | Discharge status: Home / Self Care | Payer: Medicaid Other | Source: Ambulatory Visit | Attending: Physician Assistant | Admitting: Physician Assistant

## 2018-09-19 ENCOUNTER — Other Ambulatory Visit: Payer: Self-pay

## 2018-09-19 DIAGNOSIS — R1310 Dysphagia, unspecified: Secondary | ICD-10-CM

## 2018-09-19 NOTE — Progress Notes (Signed)
Objective Swallowing Evaluation: Type of Study: MBS-Modified Barium Swallow Study   Patient Details  Name: Austin Levy MRN: [id number] Date of Birth: 12-05-1955  Today's Date: 09/19/2018 Time: SLP Start Time (ACUTE ONLY): 1100 -SLP Stop Time (ACUTE ONLY): 1125  SLP Time Calculation (min) (ACUTE ONLY): 25 min   Past Medical History:  Past Medical History:  Diagnosis Date  . CAD (coronary artery disease)   . CKD (chronic kidney disease) stage 3, GFR 30-59 ml/min (HCC)   . Coronary artery disease    goes to [place] in [place] every 6 months  . Depression   . Diabetes mellitus    oral med  . Diabetes mellitus without complication ([place])   . GERD (gastroesophageal reflux disease)    controlled w/ prilosec  . Hypercholesteremia   . Hypertension   . Myocardial infarct [facility]) 2006 & 2007   s/p cardiac cath 2006-  medical intervention  . Non-ischemic cardiomyopathy ([place]) hx 2006-- ef 10-15%   last echo documented 2006 w/ chart  . Stroke [facility]) one yrs ago & one 2008    residual - periphral vision limited  . Stroke [facility])    Past Surgical History:  Past Surgical History:  Procedure Laterality Date  . CARDIAC CATHETERIZATION  2006 in [place]   results w/ chart (provided by sister)  . JOINT REPLACEMENT Left 04/2015   HIP   HPI: Patient referred for MBS from [facility]. He has a history of stroke and for the past 6 months has experienced strangulation, coughing and shortness of breath when eating and drinking. He reports having more difficulty with solids like sandwiches. Pt has upper and lower dentures, but reports not wearing them to eat.   Subjective: alert and cooperative    Assessment / Plan / Recommendation  CHL IP CLINICAL IMPRESSIONS 09/19/2018  Clinical Impression Patient referred for MBS from [facility]patient Therapy. He has a history of stroke and for the past 6 months has experienced strangulation, coughing and shortness of breath when eating and  drinking. He reports having more difficulty with solids like sandwiches. Pt has upper and lower dentures, but reports not wearing them to eat. Dentures present on evaluation. He has mild to moderate oropharyngeal dysphagia. Observed oral phase deficits include poor bolus control with bolus falling to floor of cavity (puree), piecemeal swallowing and prolonged mastication of solids. Patients swallow initiation was delayed to the pyriform sinuses with thin and nectar liquids, complete epiglottic inversion, complete laryngeal closure with single sips, however with serial sips uncoordinated and incomplete laryngeal closure resulting in trace penetration and passing past vocal cords eventually. No sensation of aspirated materials. With purees, mixed consistency (peaches in juice) and solids swallow was initiated at the valleculae with no penetration or aspiration. Barium pill was administered in puree with no penetration or aspiraiton. Esophageal sweep showed full clearance of espophagus with no evidence of reflux. After fluoro was off for several minutes, pt began coughing very strongly. He became short of breath but recovered quickly. Suspect coughing is related to valleculae and/or pyriform pooling after swallow.  Recommend pt continue soft foods with single cup sips of liquids. No straws and medication whole in purees. Educated patient to swallow multiple times with each bite/sip and even a dry swallow after bolus cleared from oral cavity. Recommend pt receive Dysphagia therapy for strengthening, diet recommendations and compensatory strategy training.   SLP Visit Diagnosis Dysphagia, oropharyngeal phase (R13.12)  Attention and concentration deficit following --  Frontal lobe and executive function deficit following --  Impact on safety and function Mild aspiration risk      CHL IP TREATMENT RECOMMENDATION 09/19/2018  Treatment Recommendations Defer treatment plan to f/u with SLP     No flowsheet data  found.  CHL IP DIET RECOMMENDATION 09/19/2018  SLP Diet Recommendations Dysphagia 2 (Fine chop) solids;Thin liquid  Liquid Administration via Cup  Medication Administration Whole meds with puree  Compensations Minimize environmental distractions;Slow rate;Small sips/bites;Multiple dry swallows after each bite/sip;Effortful swallow  Postural Changes Remain semi-upright after after feeds/meals (Comment);Seated upright at 90 degrees      CHL IP OTHER RECOMMENDATIONS 09/19/2018  Recommended Consults --  Oral Care Recommendations Oral care BID  Other Recommendations --      CHL IP FOLLOW UP RECOMMENDATIONS 09/19/2018  Follow up Recommendations Outpatient SLP      No flowsheet data found.         CHL IP ORAL PHASE 09/19/2018  Oral Phase Impaired  Oral - Pudding Teaspoon Holding of bolus;Piecemeal swallowing;Decreased bolus cohesion  Oral - Pudding Cup NT  Oral - Honey Teaspoon --  Oral - Honey Cup --  Oral - Nectar Teaspoon --  Oral - Nectar Cup Piecemeal swallowing;Decreased bolus cohesion  Oral - Nectar Straw --  Oral - Thin Teaspoon --  Oral - Thin Cup Decreased bolus cohesion;Piecemeal swallowing  Oral - Thin Straw --  Oral - Puree Piecemeal swallowing;Decreased bolus cohesion  Oral - Mech Soft Impaired mastication;Weak lingual manipulation;Decreased bolus cohesion;Piecemeal swallowing  Oral - Regular --  Oral - Multi-Consistency --  Oral - Pill --  Oral Phase - Comment --    CHL IP PHARYNGEAL PHASE 09/19/2018  Pharyngeal Phase Impaired  Pharyngeal- Pudding Teaspoon Delayed swallow initiation-vallecula;Pharyngeal residue - valleculae;Pharyngeal residue - pyriform  Pharyngeal --  Pharyngeal- Pudding Cup --  Pharyngeal --  Pharyngeal- Honey Teaspoon --  Pharyngeal --  Pharyngeal- Honey Cup --  Pharyngeal --  Pharyngeal- Nectar Teaspoon --  Pharyngeal --  Pharyngeal- Nectar Cup Delayed swallow initiation-pyriform sinuses;Pharyngeal residue - valleculae;Pharyngeal residue  - pyriform  Pharyngeal --  Pharyngeal- Nectar Straw --  Pharyngeal --  Pharyngeal- Thin Teaspoon --  Pharyngeal --  Pharyngeal- Thin Cup Delayed swallow initiation-pyriform sinuses;Trace aspiration;Penetration/Aspiration during swallow;Pharyngeal residue - valleculae;Pharyngeal residue - pyriform  Pharyngeal Material enters airway, passes BELOW cords and not ejected out despite cough attempt by patient  Pharyngeal- Thin Straw --  Pharyngeal --  Pharyngeal- Puree --  Pharyngeal --  Pharyngeal- Mechanical Soft Delayed swallow initiation-vallecula;Pharyngeal residue - valleculae;Pharyngeal residue - pyriform  Pharyngeal --  Pharyngeal- Regular --  Pharyngeal --  Pharyngeal- Multi-consistency Delayed swallow initiation-vallecula;Pharyngeal residue - valleculae;Pharyngeal residue - pyriform  Pharyngeal --  Pharyngeal- Pill Delayed swallow initiation-vallecula  Pharyngeal --  Pharyngeal Comment moderate residue in valleculae and pyriform sinusus     CHL IP CERVICAL ESOPHAGEAL PHASE 09/19/2018  Cervical Esophageal Phase WFL  Pudding Teaspoon --  Pudding Cup --  Honey Teaspoon --  Honey Cup --  Nectar Teaspoon --  Nectar Cup --  Nectar Straw --  Thin Teaspoon --  Thin Cup --  Thin Straw --  Puree --  Mechanical Soft --  Regular --  Multi-consistency --  Pill --  Cervical Esophageal Comment --     [place], MA, CCC-SLP 09/19/2018 12:33 PM

## 2019-06-26 ENCOUNTER — Emergency Department (HOSPITAL_BASED_OUTPATIENT_CLINIC_OR_DEPARTMENT_OTHER): Payer: Medicare Other

## 2019-06-26 ENCOUNTER — Other Ambulatory Visit: Payer: Self-pay

## 2019-06-26 ENCOUNTER — Emergency Department (HOSPITAL_BASED_OUTPATIENT_CLINIC_OR_DEPARTMENT_OTHER)
Admission: EM | Admit: 2019-06-26 | Discharge: 2019-06-26 | Disposition: A | Discharge status: Home / Self Care | Payer: Medicare Other | Attending: Emergency Medicine | Admitting: Emergency Medicine

## 2019-06-26 ENCOUNTER — Encounter (HOSPITAL_BASED_OUTPATIENT_CLINIC_OR_DEPARTMENT_OTHER): Payer: Self-pay

## 2019-06-26 DIAGNOSIS — Z96642 Presence of left artificial hip joint: Secondary | ICD-10-CM | POA: Diagnosis not present

## 2019-06-26 DIAGNOSIS — Z992 Dependence on renal dialysis: Secondary | ICD-10-CM | POA: Insufficient documentation

## 2019-06-26 DIAGNOSIS — N186 End stage renal disease: Secondary | ICD-10-CM | POA: Diagnosis not present

## 2019-06-26 DIAGNOSIS — I252 Old myocardial infarction: Secondary | ICD-10-CM | POA: Diagnosis not present

## 2019-06-26 DIAGNOSIS — E1122 Type 2 diabetes mellitus with diabetic chronic kidney disease: Secondary | ICD-10-CM | POA: Diagnosis not present

## 2019-06-26 DIAGNOSIS — R197 Diarrhea, unspecified: Secondary | ICD-10-CM | POA: Diagnosis not present

## 2019-06-26 DIAGNOSIS — Z79899 Other long term (current) drug therapy: Secondary | ICD-10-CM | POA: Diagnosis not present

## 2019-06-26 DIAGNOSIS — Z8673 Personal history of transient ischemic attack (TIA), and cerebral infarction without residual deficits: Secondary | ICD-10-CM | POA: Insufficient documentation

## 2019-06-26 DIAGNOSIS — Z7984 Long term (current) use of oral hypoglycemic drugs: Secondary | ICD-10-CM | POA: Insufficient documentation

## 2019-06-26 DIAGNOSIS — Z7982 Long term (current) use of aspirin: Secondary | ICD-10-CM | POA: Insufficient documentation

## 2019-06-26 DIAGNOSIS — K4021 Bilateral inguinal hernia, without obstruction or gangrene, recurrent: Secondary | ICD-10-CM | POA: Diagnosis not present

## 2019-06-26 DIAGNOSIS — R109 Unspecified abdominal pain: Secondary | ICD-10-CM | POA: Diagnosis present

## 2019-06-26 DIAGNOSIS — I2510 Atherosclerotic heart disease of native coronary artery without angina pectoris: Secondary | ICD-10-CM | POA: Insufficient documentation

## 2019-06-26 DIAGNOSIS — F0390 Unspecified dementia without behavioral disturbance: Secondary | ICD-10-CM | POA: Diagnosis not present

## 2019-06-26 DIAGNOSIS — R112 Nausea with vomiting, unspecified: Secondary | ICD-10-CM | POA: Insufficient documentation

## 2019-06-26 DIAGNOSIS — I120 Hypertensive chronic kidney disease with stage 5 chronic kidney disease or end stage renal disease: Secondary | ICD-10-CM | POA: Diagnosis not present

## 2019-06-26 HISTORY — DX: Dependence on renal dialysis: Z99.2

## 2019-06-26 LAB — URINALYSIS, MICROSCOPIC (REFLEX): WBC, UA: NONE SEEN WBC/hpf (ref 0–5)

## 2019-06-26 LAB — URINALYSIS, ROUTINE W REFLEX MICROSCOPIC
Bilirubin Urine: NEGATIVE
Glucose, UA: NEGATIVE mg/dL
Hgb urine dipstick: NEGATIVE
Ketones, ur: NEGATIVE mg/dL
Leukocytes,Ua: NEGATIVE
Nitrite: NEGATIVE
Protein, ur: >300 mg/dL — AB
Specific Gravity, Urine: 1.02 (ref 1.005–1.030)
pH: 8.5 — ABNORMAL HIGH (ref 5.0–8.0)

## 2019-06-26 LAB — COMPREHENSIVE METABOLIC PANEL
ALT: 9 U/L (ref 0–44)
AST: 11 U/L — ABNORMAL LOW (ref 15–41)
Albumin: 3.9 g/dL (ref 3.5–5.0)
Alkaline Phosphatase: 48 U/L (ref 38–126)
Anion gap: 9 (ref 5–15)
BUN: 15 mg/dL (ref 8–23)
CO2: 29 mmol/L (ref 22–32)
Calcium: 8.4 mg/dL — ABNORMAL LOW (ref 8.9–10.3)
Chloride: 98 mmol/L (ref 98–111)
Creatinine, Ser: 3.49 mg/dL — ABNORMAL HIGH (ref 0.61–1.24)
GFR calc Af Amer: 21 mL/min — ABNORMAL LOW (ref >60)
GFR calc non Af Amer: 18 mL/min — ABNORMAL LOW (ref >60)
Glucose, Bld: 146 mg/dL — ABNORMAL HIGH (ref 70–99)
Potassium: 3.5 mmol/L (ref 3.5–5.1)
Sodium: 136 mmol/L (ref 135–145)
Total Bilirubin: 0.1 mg/dL — ABNORMAL LOW (ref 0.3–1.2)
Total Protein: 7.2 g/dL (ref 6.5–8.1)

## 2019-06-26 LAB — CBC
HCT: 40.3 % (ref 39.0–52.0)
Hemoglobin: 12.9 g/dL — ABNORMAL LOW (ref 13.0–17.0)
MCH: 26.4 pg (ref 26.0–34.0)
MCHC: 32 g/dL (ref 30.0–36.0)
MCV: 82.4 fL (ref 80.0–100.0)
Platelets: 223 10*3/uL (ref 150–400)
RBC: 4.89 MIL/uL (ref 4.22–5.81)
RDW: 15.4 % (ref 11.5–15.5)
WBC: 4.1 10*3/uL (ref 4.0–10.5)
nRBC: 0 % (ref 0.0–0.2)

## 2019-06-26 LAB — LIPASE, BLOOD: Lipase: 25 U/L (ref 11–51)

## 2019-06-26 MED ORDER — TRAMADOL HCL 50 MG PO TABS: 50.0000 mg | ORAL_TABLET | Freq: Four times a day (QID) | ORAL | 0 refills | Status: DC | PRN

## 2019-06-26 MED ORDER — ONDANSETRON 4 MG PO TBDP: ORAL_TABLET | ORAL | 0 refills | Status: AC

## 2019-06-26 MED ORDER — SODIUM CHLORIDE 0.9 % IV BOLUS: 1000.0000 mL | Freq: Once | INTRAVENOUS | Status: DC

## 2019-06-26 NOTE — Discharge Instructions (Addendum)
You have inguinal hernias on both sides that is likely causing your pain.  Take Tylenol for pain and take tramadol for severe pain.  You may also have a stomach virus as well.  Take Zofran as needed for nausea.  Please follow-up with the surgeon for your hernias.  Return to ER if you have severe abdominal pain, vomiting, fevers, not passing gas.

## 2019-06-26 NOTE — ED Provider Notes (Signed)
[facility] EMERGENCY DEPARTMENT Provider Note   CSN: [id number] Arrival date & time: 06/26/19  1743     History Chief Complaint  Patient presents with  . Abdominal Pain    Austin Levy is a 64 y.o. male history of CAD, CKD on dialysis (last dialysis was today), diabetes, hypertension who presented with abdominal pain, diarrhea, vomiting.  Patient states that for the last 4 to 5 days, patient has been having left-sided abdominal pain.  It is intermittent and usually after dialysis.  Is associated with some nausea and vomiting and diarrhea .  Sister is sick with similar symptoms.  Finish dialysis today and had worsening abdominal pain so came here for evaluation.  The history is provided by the patient.       Past Medical History:  Diagnosis Date  . CAD (coronary artery disease)   . CKD (chronic kidney disease) stage 3, GFR 30-59 ml/min   . Coronary artery disease    goes to [place] in [place] every 6 months  . Depression   . Diabetes mellitus    oral med  . Diabetes mellitus without complication ([place])   . Dialysis patient ([place])   . GERD (gastroesophageal reflux disease)    controlled w/ prilosec  . Hypercholesteremia   . Hypertension   . Myocardial infarct [facility]) 2006 & 2007   s/p cardiac cath 2006-  medical intervention  . Non-ischemic cardiomyopathy ([place]) hx 2006-- ef 10-15%   last echo documented 2006 w/ chart  . Stroke [facility]) one yrs ago & one 2008    residual - periphral vision limited  . Stroke [facility])     Patient Active Problem List   Diagnosis Date Noted  . Cough 05/13/2015  . Anemia, iron deficiency 05/13/2015  . HTN (hypertension) 05/04/2015  . H/O: CVA (cerebrovascular accident) 05/04/2015  . CAD (coronary artery disease), native coronary artery 05/04/2015  . Depression 05/04/2015  . Dementia without behavioral disturbance ([place]) 05/04/2015  . Hyperlipidemia 05/04/2015  . Intertrochanteric fracture of left hip ([place]) 05/01/2015  . HCAP  (healthcare-associated pneumonia) 05/01/2015  . Postoperative anemia due to acute blood loss 05/01/2015  . Dementia without behavioral disturbance ([place]) 05/01/2015  . Hyperlipidemia 05/01/2015  . Diabetes mellitus without complication ([place])   . Hypertension   . Depression   . H/O: CVA (cerebrovascular accident)   . CAD (coronary artery disease)   . CKD (chronic kidney disease) stage 3, GFR 30-59 ml/min   . Intertrochanteric fracture of left hip ([place]) 04/29/2015  . HCAP (healthcare-associated pneumonia) 04/29/2015  . Postoperative anemia due to acute blood loss 04/29/2015  . Diabetes mellitus type 2, controlled, with complications ([place]) 51/04/5850  . CKD (chronic kidney disease) stage 3, GFR 30-59 ml/min 04/29/2015  . Gout of big toe 11/25/2013  . Pain, foot 11/25/2013    Past Surgical History:  Procedure Laterality Date  . CARDIAC CATHETERIZATION  2006 in [place]   results w/ chart (provided by sister)  . JOINT REPLACEMENT Left 04/2015   HIP       No family history on file.  Social History   Tobacco Use  . Smoking status: Never Smoker  . Smokeless tobacco: Never Used  Substance Use Topics  . Alcohol use: Yes    Comment: occassionally  . Drug use: No    Home Medications Prior to Admission medications   Medication Sig Start Date End Date Taking? Authorizing Provider  amLODipine (NORVASC) 10 MG tablet Take 10 mg by mouth every morning.  [provider]  amLODipine (NORVASC) 10 MG tablet Take 10 mg by mouth daily.    [provider]  aspirin 81 MG tablet Take 81 mg by mouth every morning.     [provider]  aspirin 81 MG tablet Take 81 mg by mouth daily.    [provider]  bisacodyl (DULCOLAX) 5 MG EC tablet Take 10 mg by mouth daily as needed for moderate constipation.    [provider]  cholecalciferol (VITAMIN D) 1000 units tablet Take 1,000 Units by mouth daily.    [provider]  cholecalciferol  (VITAMIN D) 1000 units tablet Take 1,000 Units by mouth daily.    [provider]  ciprofloxacin (CILOXAN) 0.3 % ophthalmic solution Place 1 drop into both eyes QID. Administer 1 drop, every 2 hours, while awake, for 2 days. Then 1 drop, every 4 hours, while awake, for the next 5 days.    [provider]  clopidogrel (PLAVIX) 75 MG tablet Take 75 mg by mouth daily.    [provider]  enoxaparin (LOVENOX) 30 MG/0.3ML injection Inject 30 mg into the skin daily.    [provider]  ferrous sulfate 325 (65 FE) MG tablet Take 325 mg by mouth daily with breakfast.    [provider]  ferrous sulfate 325 (65 FE) MG tablet Take 325 mg by mouth daily with breakfast.    [provider]  FLUoxetine (PROZAC) 10 MG capsule Take 10 mg by mouth daily.    [provider]  FLUoxetine (PROZAC) 10 MG capsule Take 10 mg by mouth daily.    [provider]  glipiZIDE (GLUCOTROL) 10 MG tablet Take 10 mg by mouth 2 (two) times daily before a meal.     [provider]  glipiZIDE (GLUCOTROL) 10 MG tablet Take 10 mg by mouth 2 (two) times daily before a meal.    [provider]  hydrALAZINE (APRESOLINE) 100 MG tablet Take 100 mg by mouth 2 (two) times daily.    [provider]  hydrALAZINE (APRESOLINE) 100 MG tablet Take 100 mg by mouth 2 (two) times daily.    [provider]  levofloxacin (LEVAQUIN) 750 MG tablet Take 750 mg by mouth daily. For 4 doses    [provider]  omeprazole (PRILOSEC) 20 MG capsule Take 20 mg by mouth every morning.     [provider]  omeprazole (PRILOSEC) 20 MG capsule Take 20 mg by mouth daily.    [provider]  oxycodone (OXY-IR) 5 MG capsule Take 5 mg by mouth every 6 (six) hours as needed.    [provider]  polyethylene glycol (MIRALAX / GLYCOLAX) packet Take 17 g by mouth daily as needed.    [provider]  pravastatin (PRAVACHOL) 40  MG tablet Take 40 mg by mouth at bedtime.     [provider]  pravastatin (PRAVACHOL) 40 MG tablet Take 40 mg by mouth daily.    [provider]    Allergies    Patient has no known allergies.  Review of Systems   Review of Systems  Gastrointestinal: Positive for abdominal pain.  All other systems reviewed and are negative.   Physical Exam Updated Vital Signs BP (!) 169/90 (BP Location: Right Arm)   Pulse 74   Temp 98.5 F (36.9 C) (Oral)   Resp 16   Ht 5\' 11"  (1.803 m)   Wt 76.2 kg   SpO2 98%   BMI 23.43 kg/m  Physical Exam Vitals and nursing note reviewed.  HENT:     Head: Normocephalic.  Eyes:     Extraocular Movements: Extraocular movements intact.  Cardiovascular:     Rate and Rhythm: Normal rate and regular rhythm.     Heart sounds: Normal heart sounds.  Pulmonary:     Effort: Pulmonary effort is normal.     Breath sounds: Normal breath sounds.  Abdominal:     General: Abdomen is flat.     Palpations: Abdomen is soft.     Comments: Small bilateral inguinal hernias, easily reducible   Genitourinary:    Comments: Testicles nontender  Skin:    General: Skin is warm.     Capillary Refill: Capillary refill takes less than 2 seconds.  Neurological:     General: No focal deficit present.     Mental Status: He is alert and oriented to person, place, and time.  Psychiatric:        Mood and Affect: Mood normal.        Behavior: Behavior normal.     ED Results / Procedures / Treatments   Labs (all labs ordered are listed, but only abnormal results are displayed) Labs Reviewed  URINALYSIS, ROUTINE W REFLEX MICROSCOPIC - Abnormal; Notable for the following components:      Result Value   pH 8.5 (*)    Protein, ur >300 (*)    All other components within normal limits  COMPREHENSIVE METABOLIC PANEL - Abnormal; Notable for the following components:   Glucose, Bld 146 (*)    Creatinine, Ser 3.49 (*)    Calcium 8.4 (*)    AST 11 (*)     Total Bilirubin 0.1 (*)    GFR calc non Af Amer 18 (*)    GFR calc Af Amer 21 (*)    All other components within normal limits  CBC - Abnormal; Notable for the following components:   Hemoglobin 12.9 (*)    All other components within normal limits  URINALYSIS, MICROSCOPIC (REFLEX) - Abnormal; Notable for the following components:   Bacteria, UA RARE (*)    All other components within normal limits  LIPASE, BLOOD    CT renal stone impression  IMPRESSION: Bilateral inguinal hernias, left larger than right. The sigmoid colon is within the left inguinal hernia and the appendix is within the right inguinal hernia. No evidence of bowel obstruction.  Cholelithiasis.  No renal or ureteral stones or hydronephrosis. Bilateral low-density lesions within the kidneys likely reflects cysts but cannot be characterized without IV contrast.  Left base atelectasis.  Aortic atherosclerosis.  Prostate enlargement.  EKG None  Radiology No results found.  Procedures Procedures (including critical care time)  Medications Ordered in ED Medications - No data to display  ED Course  I have reviewed the triage vital signs and the nursing notes.  Pertinent labs & imaging results that were available during my care of the patient were reviewed by me and considered in my medical decision making (see chart for details).    MDM Rules/Calculators/A&P                      Austin Levy is a 64 y.o. male ESRD on dialysis here presenting with abdominal pain.  Patient has intermittent abdominal pain for the last several days.  Has some diarrhea as well and his sister is sick with similar symptoms. I think likely viral gastroenteritis.  Has small inguinal hernias that are easily reducible.  Will get  CBC, CMP, CT renal stone to further assess.  10:21 PM CT showed bilateral inguinal hernias with no obstruction. Labs at baseline. Baseline Cr is around 7 on care everywhere. Not hyperkalemic. Stable for  discharge   Final Clinical Impression(s) / ED Diagnoses Final diagnoses:  None    Rx / DC Orders ED Discharge Orders    None       Drenda Freeze, MD 06/26/19 2222

## 2019-06-26 NOTE — ED Notes (Signed)
ED Provider at bedside. 

## 2019-06-26 NOTE — ED Triage Notes (Signed)
C/oleft side abd/flank pain x 4 days-info per pt and sister/caretaker-sent from PCP-pt to triage in w/c-NAD

## 2019-06-26 NOTE — ED Notes (Signed)
PT unable to give urine sample.

## 2020-01-01 ENCOUNTER — Emergency Department (HOSPITAL_COMMUNITY)
Admission: EM | Admit: 2020-01-01 | Discharge: 2020-01-02 | Disposition: A | Discharge status: Home / Self Care | Payer: Medicare Other | Attending: Emergency Medicine | Admitting: Emergency Medicine

## 2020-01-01 ENCOUNTER — Other Ambulatory Visit: Payer: Self-pay

## 2020-01-01 ENCOUNTER — Encounter (HOSPITAL_COMMUNITY): Payer: Self-pay | Admitting: Emergency Medicine

## 2020-01-01 DIAGNOSIS — N186 End stage renal disease: Secondary | ICD-10-CM | POA: Diagnosis not present

## 2020-01-01 DIAGNOSIS — I2510 Atherosclerotic heart disease of native coronary artery without angina pectoris: Secondary | ICD-10-CM | POA: Insufficient documentation

## 2020-01-01 DIAGNOSIS — I1311 Hypertensive heart and chronic kidney disease without heart failure, with stage 5 chronic kidney disease, or end stage renal disease: Secondary | ICD-10-CM | POA: Insufficient documentation

## 2020-01-01 DIAGNOSIS — I10 Essential (primary) hypertension: Secondary | ICD-10-CM

## 2020-01-01 DIAGNOSIS — Z79899 Other long term (current) drug therapy: Secondary | ICD-10-CM | POA: Insufficient documentation

## 2020-01-01 DIAGNOSIS — Z7984 Long term (current) use of oral hypoglycemic drugs: Secondary | ICD-10-CM | POA: Diagnosis not present

## 2020-01-01 DIAGNOSIS — S2242XA Multiple fractures of ribs, left side, initial encounter for closed fracture: Secondary | ICD-10-CM

## 2020-01-01 DIAGNOSIS — S299XXA Unspecified injury of thorax, initial encounter: Secondary | ICD-10-CM | POA: Diagnosis present

## 2020-01-01 DIAGNOSIS — W19XXXA Unspecified fall, initial encounter: Secondary | ICD-10-CM | POA: Diagnosis not present

## 2020-01-01 DIAGNOSIS — E119 Type 2 diabetes mellitus without complications: Secondary | ICD-10-CM | POA: Insufficient documentation

## 2020-01-01 DIAGNOSIS — F0390 Unspecified dementia without behavioral disturbance: Secondary | ICD-10-CM | POA: Insufficient documentation

## 2020-01-01 DIAGNOSIS — Z7982 Long term (current) use of aspirin: Secondary | ICD-10-CM | POA: Diagnosis not present

## 2020-01-01 DIAGNOSIS — Z992 Dependence on renal dialysis: Secondary | ICD-10-CM | POA: Diagnosis not present

## 2020-01-01 NOTE — ED Triage Notes (Signed)
  [facility] for evaluation of 3 broken ribs on his left side, pt fell on Tuesday, denies any SOB at this time.

## 2020-01-02 DIAGNOSIS — S2242XA Multiple fractures of ribs, left side, initial encounter for closed fracture: Secondary | ICD-10-CM | POA: Diagnosis not present

## 2020-01-02 MED ORDER — HYDROCODONE-ACETAMINOPHEN 5-325 MG PO TABS: 1.0000 | ORAL_TABLET | ORAL | 0 refills | Status: AC | PRN

## 2020-01-02 MED ORDER — HYDROCODONE-ACETAMINOPHEN 5-325 MG PO TABS
1.0000 | ORAL_TABLET | Freq: Once | ORAL | Status: AC
  Administered 2020-01-02: 1 via ORAL
  Filled 2020-01-02: qty 1

## 2020-01-02 NOTE — Discharge Instructions (Signed)
Return if pain is not being adequately controlled, or if you start running a fever.

## 2020-01-02 NOTE — ED Provider Notes (Signed)
[facility] EMERGENCY DEPARTMENT Provider Note   CSN: [id number] Arrival date & time: 01/01/20  2203   History Chief Complaint  Patient presents with  . Fall    Austin Levy is a 64 y.o. male.  The history is provided by the patient and the spouse.  Fall  He has history of hypertension, diabetes, hyperlipidemia, coronary artery disease, chronic kidney disease on hemodialysis and was referred here from an urgent care center because of rib fractures.  He had fallen in his bathroom 2 days ago injuring the left rib cage.  He was noted at dialysis to have some bruising and it was recommended that he be evaluated.  He went to urgent care where x-ray showed 3 fractured ribs, and he was sent here for further evaluation.  Past Medical History:  Diagnosis Date  . CAD (coronary artery disease)   . CKD (chronic kidney disease) stage 3, GFR 30-59 ml/min (HCC)   . Coronary artery disease    goes to [place] in [place] every 6 months  . Depression   . Diabetes mellitus    oral med  . Diabetes mellitus without complication ([place])   . Dialysis patient ([place])   . GERD (gastroesophageal reflux disease)    controlled w/ prilosec  . Hypercholesteremia   . Hypertension   . Myocardial infarct [facility]) 2006 & 2007   s/p cardiac cath 2006-  medical intervention  . Non-ischemic cardiomyopathy ([place]) hx 2006-- ef 10-15%   last echo documented 2006 w/ chart  . Stroke [facility]) one yrs ago & one 2008    residual - periphral vision limited  . Stroke [facility])     Patient Active Problem List   Diagnosis Date Noted  . Cough 05/13/2015  . Anemia, iron deficiency 05/13/2015  . HTN (hypertension) 05/04/2015  . H/O: CVA (cerebrovascular accident) 05/04/2015  . CAD (coronary artery disease), native coronary artery 05/04/2015  . Depression 05/04/2015  . Dementia without behavioral disturbance ([place]) 05/04/2015  . Hyperlipidemia 05/04/2015  . Intertrochanteric fracture of left hip ([place])  05/01/2015  . HCAP (healthcare-associated pneumonia) 05/01/2015  . Postoperative anemia due to acute blood loss 05/01/2015  . Dementia without behavioral disturbance ([place]) 05/01/2015  . Hyperlipidemia 05/01/2015  . Diabetes mellitus without complication ([place])   . Hypertension   . Depression   . H/O: CVA (cerebrovascular accident)   . CAD (coronary artery disease)   . CKD (chronic kidney disease) stage 3, GFR 30-59 ml/min (HCC)   . Intertrochanteric fracture of left hip ([place]) 04/29/2015  . HCAP (healthcare-associated pneumonia) 04/29/2015  . Postoperative anemia due to acute blood loss 04/29/2015  . Diabetes mellitus type 2, controlled, with complications ([place]) 94/76/5465  . CKD (chronic kidney disease) stage 3, GFR 30-59 ml/min (HCC) 04/29/2015  . Gout of big toe 11/25/2013  . Pain, foot 11/25/2013    Past Surgical History:  Procedure Laterality Date  . CARDIAC CATHETERIZATION  2006 in [place]   results w/ chart (provided by sister)  . JOINT REPLACEMENT Left 04/2015   HIP       No family history on file.  Social History   Tobacco Use  . Smoking status: Never Smoker  . Smokeless tobacco: Never Used  Vaping Use  . Vaping Use: Never used  Substance Use Topics  . Alcohol use: Yes    Comment: occassionally  . Drug use: No    Home Medications Prior to Admission medications   Medication Sig Start Date End Date Taking? Authorizing Provider  amLODipine (  NORVASC) 10 MG tablet Take 10 mg by mouth every morning.     [provider]  amLODipine (NORVASC) 10 MG tablet Take 10 mg by mouth daily.    [provider]  aspirin 81 MG tablet Take 81 mg by mouth every morning.     [provider]  aspirin 81 MG tablet Take 81 mg by mouth daily.    [provider]  bisacodyl (DULCOLAX) 5 MG EC tablet Take 10 mg by mouth daily as needed for moderate constipation.    [provider]  cholecalciferol (VITAMIN D) 1000 units tablet Take 1,000  Units by mouth daily.    [provider]  cholecalciferol (VITAMIN D) 1000 units tablet Take 1,000 Units by mouth daily.    [provider]  ciprofloxacin (CILOXAN) 0.3 % ophthalmic solution Place 1 drop into both eyes QID. Administer 1 drop, every 2 hours, while awake, for 2 days. Then 1 drop, every 4 hours, while awake, for the next 5 days.    [provider]  clopidogrel (PLAVIX) 75 MG tablet Take 75 mg by mouth daily.    [provider]  enoxaparin (LOVENOX) 30 MG/0.3ML injection Inject 30 mg into the skin daily.    [provider]  ferrous sulfate 325 (65 FE) MG tablet Take 325 mg by mouth daily with breakfast.    [provider]  ferrous sulfate 325 (65 FE) MG tablet Take 325 mg by mouth daily with breakfast.    [provider]  FLUoxetine (PROZAC) 10 MG capsule Take 10 mg by mouth daily.    [provider]  FLUoxetine (PROZAC) 10 MG capsule Take 10 mg by mouth daily.    [provider]  glipiZIDE (GLUCOTROL) 10 MG tablet Take 10 mg by mouth 2 (two) times daily before a meal.     [provider]  glipiZIDE (GLUCOTROL) 10 MG tablet Take 10 mg by mouth 2 (two) times daily before a meal.    [provider]  hydrALAZINE (APRESOLINE) 100 MG tablet Take 100 mg by mouth 2 (two) times daily.    [provider]  hydrALAZINE (APRESOLINE) 100 MG tablet Take 100 mg by mouth 2 (two) times daily.    [provider]  levofloxacin (LEVAQUIN) 750 MG tablet Take 750 mg by mouth daily. For 4 doses    [provider]  omeprazole (PRILOSEC) 20 MG capsule Take 20 mg by mouth every morning.     [provider]  omeprazole (PRILOSEC) 20 MG capsule Take 20 mg by mouth daily.    [provider]  ondansetron (ZOFRAN ODT) 4 MG disintegrating tablet 4mg  ODT q4 hours prn nausea/vomit 06/26/19   Drenda Freeze, MD  oxycodone (OXY-IR) 5 MG capsule Take 5 mg by mouth every 6 (six)  hours as needed.    [provider]  polyethylene glycol (MIRALAX / GLYCOLAX) packet Take 17 g by mouth daily as needed.    [provider]  pravastatin (PRAVACHOL) 40 MG tablet Take 40 mg by mouth at bedtime.     [provider]  pravastatin (PRAVACHOL) 40 MG tablet Take 40 mg by mouth daily.    [provider]  traMADol (ULTRAM) 50 MG tablet Take 1 tablet (50 mg total) by mouth every 6 (six) hours as needed. 06/26/19   Drenda Freeze, MD    Allergies    Patient has no known allergies.  Review of Systems   Review of Systems  All  other systems reviewed and are negative.   Physical Exam Updated Vital Signs BP (!) 172/95 (BP Location: Right Arm)   Pulse 96   Temp 99.1 F (37.3 C) (Oral)   Resp 16   SpO2 96%   Physical Exam Vitals and nursing note reviewed.   64 year old male, resting comfortably and in no acute distress. Vital signs are significant for elevated blood pressure. Oxygen saturation is 96%, which is normal. Head is normocephalic and atraumatic. PERRLA, EOMI. Oropharynx is clear. Neck is nontender and supple without adenopathy or JVD. Back is nontender and there is no CVA tenderness. Lungs are clear without rales, wheezes, or rhonchi. Chest: Ecchymosis present in the left posterior rib cage.  There is tenderness in the posterior and lateral aspect of the lower left rib cage without crepitus. Heart has regular rate and rhythm without murmur. Abdomen is soft, flat, nontender without masses or hepatosplenomegaly and peristalsis is normoactive. Extremities have no cyanosis or edema, full range of motion is present.  AV fistula present in left upper arm with thrill present. Skin is warm and dry without rash. Neurologic: Mental status is normal, cranial nerves are intact, there are no motor or sensory deficits.  ED Results / Procedures / Treatments    Procedures Procedures  Medications Ordered in ED Medications    HYDROcodone-acetaminophen (NORCO/VICODIN) 5-325 MG per tablet 1 tablet (1 tablet Oral Given 01/02/20 0054)    ED Course  I have reviewed the triage vital signs and the nursing notes.  Pertinent imaging results that were available during my care of the patient were reviewed by me and considered in my medical decision making (see chart for details).  MDM Rules/Calculators/A&P Fall with fracture of 3 ribs on the left.  Old records are reviewed confirming recent urgent care visit.  I was able to pull up the images from his rib x-rays and there is a very small left pleural effusion but no pneumothorax.  Injury occurred more than 2 days ago and he is maintaining adequate oxygen saturations.  He needs pain control but no indication for further investigation today.  He is given an incentive spirometer to use at home and given prescription for hydrocodone-acetaminophen.  Follow-up with PCP.  Return precautions discussed.  Final Clinical Impression(s) / ED Diagnoses Final diagnoses:  Closed fracture of three ribs of left side, initial encounter  Elevated blood pressure reading with diagnosis of hypertension  End-stage renal disease on hemodialysis [facility])    Rx / DC Orders ED Discharge Orders         Ordered    HYDROcodone-acetaminophen (NORCO) 5-325 MG tablet  Every 4 hours PRN        01/02/20 8676           Delora Fuel, MD 72/09/47 0106

## 2021-02-09 DIAGNOSIS — E1122 Type 2 diabetes mellitus with diabetic chronic kidney disease: Secondary | ICD-10-CM | POA: Diagnosis not present

## 2021-02-28 DIAGNOSIS — Z1159 Encounter for screening for other viral diseases: Secondary | ICD-10-CM | POA: Diagnosis not present

## 2021-02-28 DIAGNOSIS — Z114 Encounter for screening for human immunodeficiency virus [HIV]: Secondary | ICD-10-CM | POA: Diagnosis not present

## 2021-03-07 DIAGNOSIS — N186 End stage renal disease: Secondary | ICD-10-CM | POA: Diagnosis not present

## 2021-03-07 DIAGNOSIS — N2581 Secondary hyperparathyroidism of renal origin: Secondary | ICD-10-CM | POA: Diagnosis not present

## 2021-03-07 DIAGNOSIS — D631 Anemia in chronic kidney disease: Secondary | ICD-10-CM | POA: Diagnosis not present

## 2021-03-07 DIAGNOSIS — D509 Iron deficiency anemia, unspecified: Secondary | ICD-10-CM | POA: Diagnosis not present

## 2021-03-08 DIAGNOSIS — E7800 Pure hypercholesterolemia, unspecified: Secondary | ICD-10-CM | POA: Diagnosis not present

## 2021-03-09 DIAGNOSIS — E1122 Type 2 diabetes mellitus with diabetic chronic kidney disease: Secondary | ICD-10-CM | POA: Diagnosis not present

## 2021-03-09 DIAGNOSIS — N186 End stage renal disease: Secondary | ICD-10-CM | POA: Diagnosis not present

## 2021-03-09 DIAGNOSIS — D631 Anemia in chronic kidney disease: Secondary | ICD-10-CM | POA: Diagnosis not present

## 2021-03-09 DIAGNOSIS — N2581 Secondary hyperparathyroidism of renal origin: Secondary | ICD-10-CM | POA: Diagnosis not present

## 2021-03-09 DIAGNOSIS — D509 Iron deficiency anemia, unspecified: Secondary | ICD-10-CM | POA: Diagnosis not present

## 2021-03-11 DIAGNOSIS — N186 End stage renal disease: Secondary | ICD-10-CM | POA: Diagnosis not present

## 2021-03-11 DIAGNOSIS — D509 Iron deficiency anemia, unspecified: Secondary | ICD-10-CM | POA: Diagnosis not present

## 2021-03-11 DIAGNOSIS — N2581 Secondary hyperparathyroidism of renal origin: Secondary | ICD-10-CM | POA: Diagnosis not present

## 2021-03-11 DIAGNOSIS — D631 Anemia in chronic kidney disease: Secondary | ICD-10-CM | POA: Diagnosis not present

## 2021-03-14 DIAGNOSIS — N2581 Secondary hyperparathyroidism of renal origin: Secondary | ICD-10-CM | POA: Diagnosis not present

## 2021-03-14 DIAGNOSIS — D631 Anemia in chronic kidney disease: Secondary | ICD-10-CM | POA: Diagnosis not present

## 2021-03-14 DIAGNOSIS — N186 End stage renal disease: Secondary | ICD-10-CM | POA: Diagnosis not present

## 2021-03-14 DIAGNOSIS — D509 Iron deficiency anemia, unspecified: Secondary | ICD-10-CM | POA: Diagnosis not present

## 2021-03-16 DIAGNOSIS — N2581 Secondary hyperparathyroidism of renal origin: Secondary | ICD-10-CM | POA: Diagnosis not present

## 2021-03-16 DIAGNOSIS — N186 End stage renal disease: Secondary | ICD-10-CM | POA: Diagnosis not present

## 2021-03-16 DIAGNOSIS — D509 Iron deficiency anemia, unspecified: Secondary | ICD-10-CM | POA: Diagnosis not present

## 2021-03-16 DIAGNOSIS — D631 Anemia in chronic kidney disease: Secondary | ICD-10-CM | POA: Diagnosis not present

## 2021-03-18 DIAGNOSIS — N2581 Secondary hyperparathyroidism of renal origin: Secondary | ICD-10-CM | POA: Diagnosis not present

## 2021-03-18 DIAGNOSIS — D509 Iron deficiency anemia, unspecified: Secondary | ICD-10-CM | POA: Diagnosis not present

## 2021-03-18 DIAGNOSIS — N186 End stage renal disease: Secondary | ICD-10-CM | POA: Diagnosis not present

## 2021-03-18 DIAGNOSIS — D631 Anemia in chronic kidney disease: Secondary | ICD-10-CM | POA: Diagnosis not present

## 2021-03-21 DIAGNOSIS — D509 Iron deficiency anemia, unspecified: Secondary | ICD-10-CM | POA: Diagnosis not present

## 2021-03-21 DIAGNOSIS — D631 Anemia in chronic kidney disease: Secondary | ICD-10-CM | POA: Diagnosis not present

## 2021-03-21 DIAGNOSIS — N186 End stage renal disease: Secondary | ICD-10-CM | POA: Diagnosis not present

## 2021-03-21 DIAGNOSIS — N2581 Secondary hyperparathyroidism of renal origin: Secondary | ICD-10-CM | POA: Diagnosis not present

## 2021-03-23 DIAGNOSIS — D509 Iron deficiency anemia, unspecified: Secondary | ICD-10-CM | POA: Diagnosis not present

## 2021-03-23 DIAGNOSIS — D631 Anemia in chronic kidney disease: Secondary | ICD-10-CM | POA: Diagnosis not present

## 2021-03-23 DIAGNOSIS — N2581 Secondary hyperparathyroidism of renal origin: Secondary | ICD-10-CM | POA: Diagnosis not present

## 2021-03-23 DIAGNOSIS — N186 End stage renal disease: Secondary | ICD-10-CM | POA: Diagnosis not present

## 2021-03-25 DIAGNOSIS — N186 End stage renal disease: Secondary | ICD-10-CM | POA: Diagnosis not present

## 2021-03-25 DIAGNOSIS — D631 Anemia in chronic kidney disease: Secondary | ICD-10-CM | POA: Diagnosis not present

## 2021-03-25 DIAGNOSIS — D509 Iron deficiency anemia, unspecified: Secondary | ICD-10-CM | POA: Diagnosis not present

## 2021-03-25 DIAGNOSIS — N2581 Secondary hyperparathyroidism of renal origin: Secondary | ICD-10-CM | POA: Diagnosis not present

## 2021-03-28 DIAGNOSIS — N186 End stage renal disease: Secondary | ICD-10-CM | POA: Diagnosis not present

## 2021-03-28 DIAGNOSIS — D631 Anemia in chronic kidney disease: Secondary | ICD-10-CM | POA: Diagnosis not present

## 2021-03-28 DIAGNOSIS — D509 Iron deficiency anemia, unspecified: Secondary | ICD-10-CM | POA: Diagnosis not present

## 2021-03-28 DIAGNOSIS — N2581 Secondary hyperparathyroidism of renal origin: Secondary | ICD-10-CM | POA: Diagnosis not present

## 2021-03-30 DIAGNOSIS — N2581 Secondary hyperparathyroidism of renal origin: Secondary | ICD-10-CM | POA: Diagnosis not present

## 2021-03-30 DIAGNOSIS — D631 Anemia in chronic kidney disease: Secondary | ICD-10-CM | POA: Diagnosis not present

## 2021-03-30 DIAGNOSIS — D509 Iron deficiency anemia, unspecified: Secondary | ICD-10-CM | POA: Diagnosis not present

## 2021-03-30 DIAGNOSIS — N186 End stage renal disease: Secondary | ICD-10-CM | POA: Diagnosis not present

## 2021-04-01 DIAGNOSIS — D509 Iron deficiency anemia, unspecified: Secondary | ICD-10-CM | POA: Diagnosis not present

## 2021-04-01 DIAGNOSIS — D631 Anemia in chronic kidney disease: Secondary | ICD-10-CM | POA: Diagnosis not present

## 2021-04-01 DIAGNOSIS — N2581 Secondary hyperparathyroidism of renal origin: Secondary | ICD-10-CM | POA: Diagnosis not present

## 2021-04-01 DIAGNOSIS — N186 End stage renal disease: Secondary | ICD-10-CM | POA: Diagnosis not present

## 2021-04-04 DIAGNOSIS — N2581 Secondary hyperparathyroidism of renal origin: Secondary | ICD-10-CM | POA: Diagnosis not present

## 2021-04-04 DIAGNOSIS — D509 Iron deficiency anemia, unspecified: Secondary | ICD-10-CM | POA: Diagnosis not present

## 2021-04-04 DIAGNOSIS — N186 End stage renal disease: Secondary | ICD-10-CM | POA: Diagnosis not present

## 2021-04-04 DIAGNOSIS — D631 Anemia in chronic kidney disease: Secondary | ICD-10-CM | POA: Diagnosis not present

## 2021-04-05 DIAGNOSIS — N186 End stage renal disease: Secondary | ICD-10-CM | POA: Diagnosis not present

## 2021-04-05 DIAGNOSIS — Z992 Dependence on renal dialysis: Secondary | ICD-10-CM | POA: Diagnosis not present

## 2021-04-06 DIAGNOSIS — N186 End stage renal disease: Secondary | ICD-10-CM | POA: Diagnosis not present

## 2021-04-06 DIAGNOSIS — E1122 Type 2 diabetes mellitus with diabetic chronic kidney disease: Secondary | ICD-10-CM | POA: Diagnosis not present

## 2021-04-06 DIAGNOSIS — D509 Iron deficiency anemia, unspecified: Secondary | ICD-10-CM | POA: Diagnosis not present

## 2021-04-06 DIAGNOSIS — N2581 Secondary hyperparathyroidism of renal origin: Secondary | ICD-10-CM | POA: Diagnosis not present

## 2021-04-06 DIAGNOSIS — D631 Anemia in chronic kidney disease: Secondary | ICD-10-CM | POA: Diagnosis not present

## 2021-04-08 DIAGNOSIS — D509 Iron deficiency anemia, unspecified: Secondary | ICD-10-CM | POA: Diagnosis not present

## 2021-04-08 DIAGNOSIS — D631 Anemia in chronic kidney disease: Secondary | ICD-10-CM | POA: Diagnosis not present

## 2021-04-08 DIAGNOSIS — N2581 Secondary hyperparathyroidism of renal origin: Secondary | ICD-10-CM | POA: Diagnosis not present

## 2021-04-08 DIAGNOSIS — N186 End stage renal disease: Secondary | ICD-10-CM | POA: Diagnosis not present

## 2021-04-09 IMAGING — CT CT RENAL STONE PROTOCOL
2 of 5 series · 16 of 46 positions shown, 18 images · non-contrast
Comparison: None.

CLINICAL DATA: Left flank pain

EXAM:
CT ABDOMEN AND PELVIS WITHOUT CONTRAST
TECHNIQUE: Multidetector CT imaging of the abdomen and pelvis was performed
following the standard protocol without IV contrast.

[Series 2: axial st · axial · 0.72mm/px · z∈[-391,+24]mm · 13 of 93 slices shown, 15 images]
[im 5/93  soft-tissue]
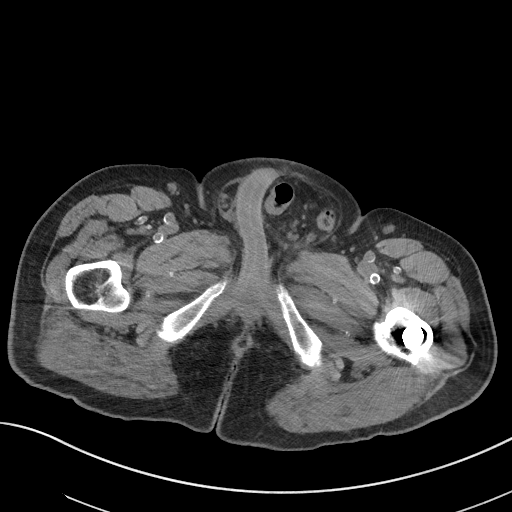
[im 5/93  bone]
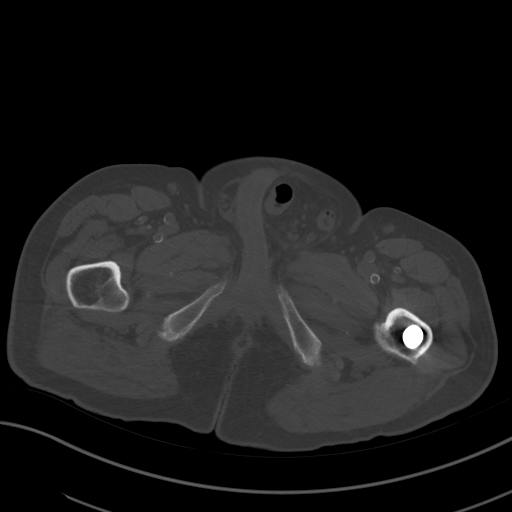
[im 15/93  soft-tissue]
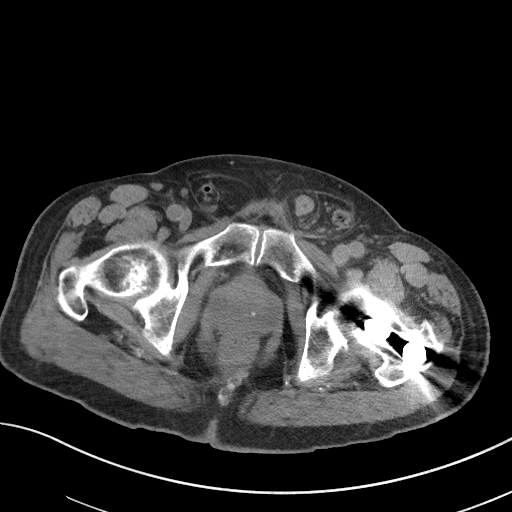
[im 20/93  soft-tissue]
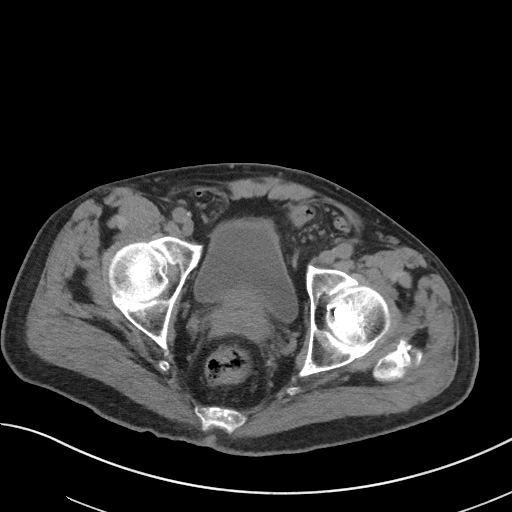
[im 25/93  soft-tissue]
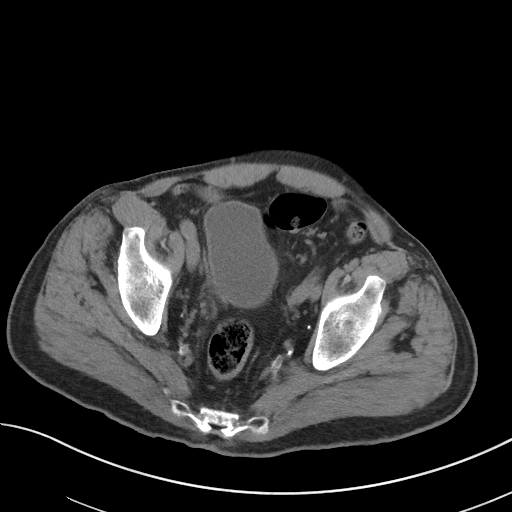
[im 34/93  soft-tissue]
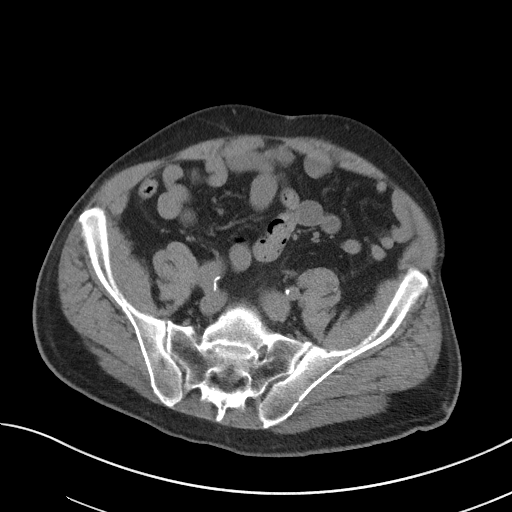
[im 39/93  soft-tissue]
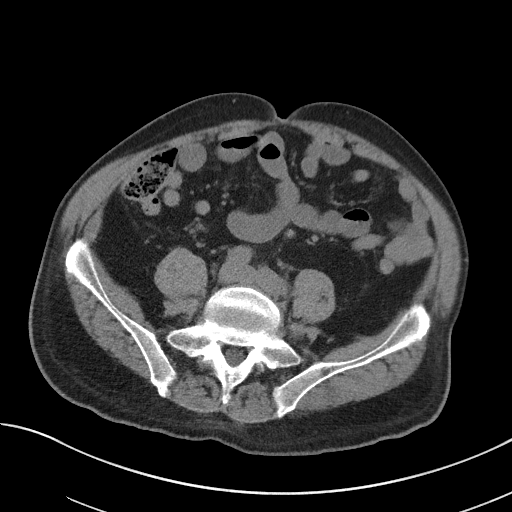
[im 49/93  soft-tissue]
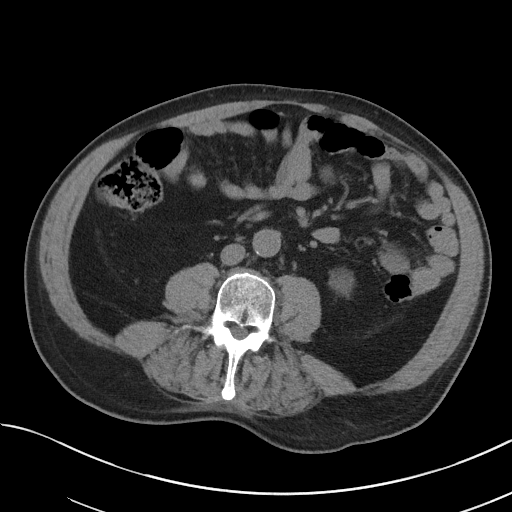
[im 54/93  soft-tissue]
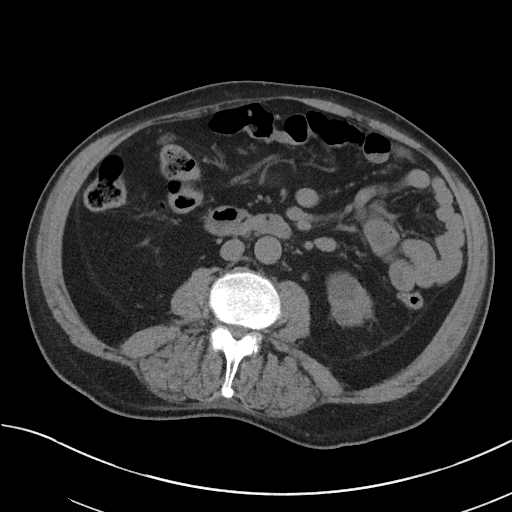
[im 59/93  soft-tissue]
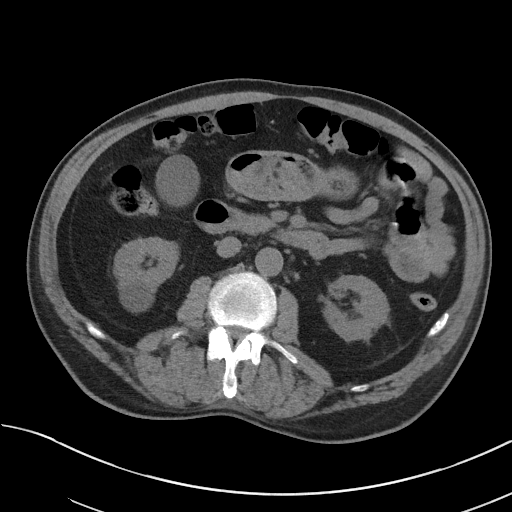
[im 59/93  bone]
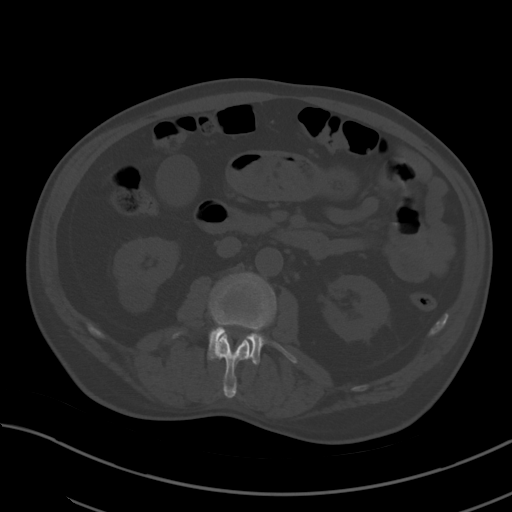
[im 68/93  soft-tissue]
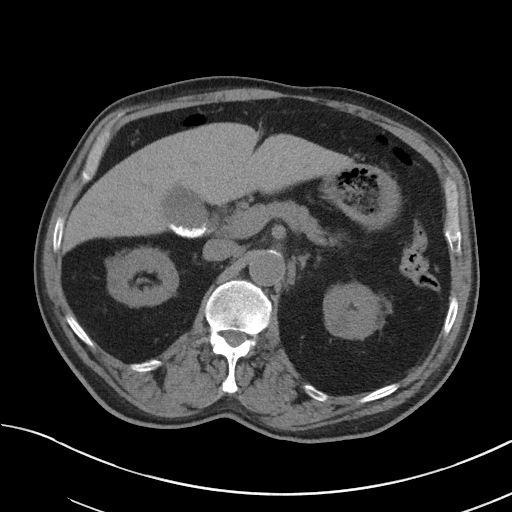
[im 73/93  soft-tissue]
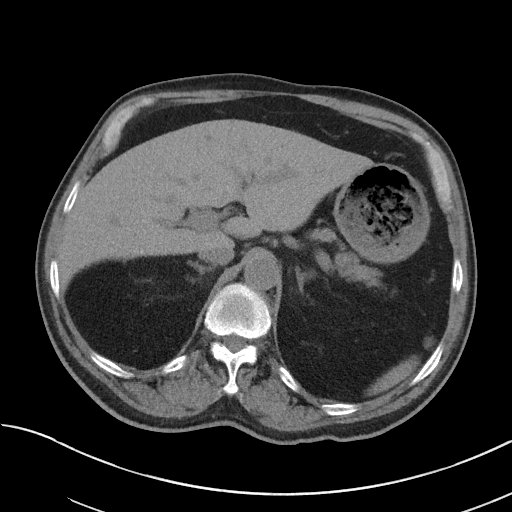
[im 78/93  soft-tissue]
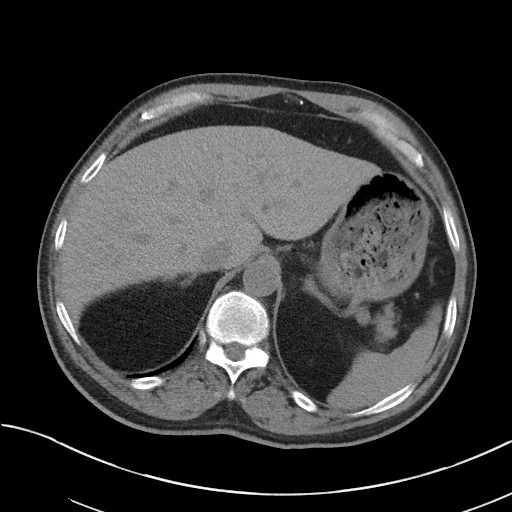
[im 88/93  soft-tissue]
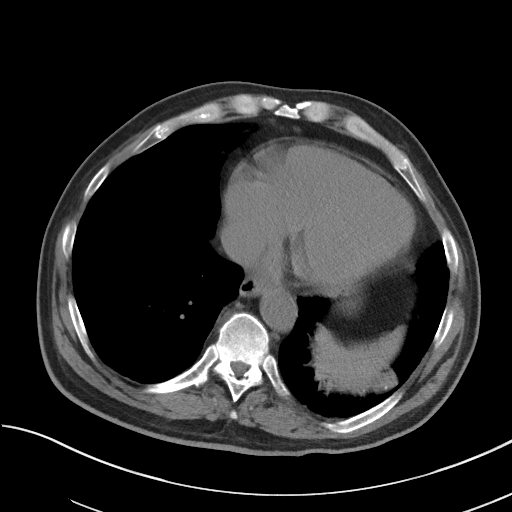

[Series 4: coronal st · coronal · 0.73mm/px · 3 of 101 slices shown]
[im 34/101  soft-tissue]
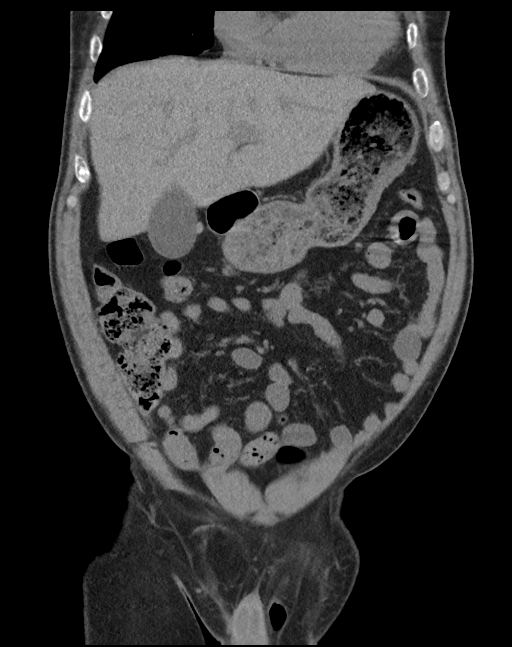
[im 45/101  soft-tissue]
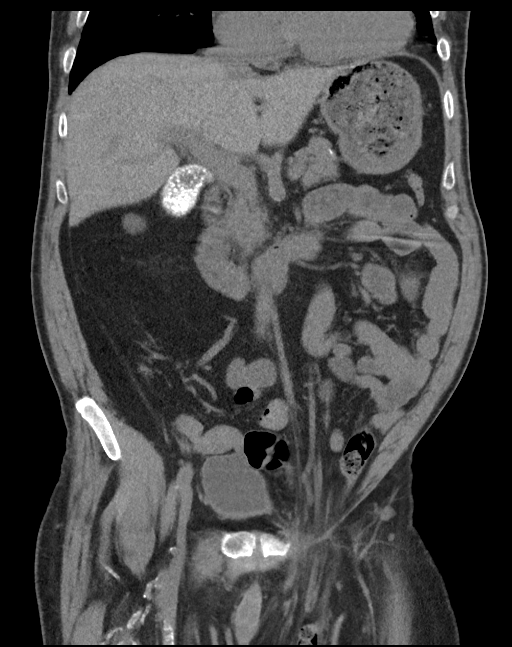
[im 56/101  soft-tissue]
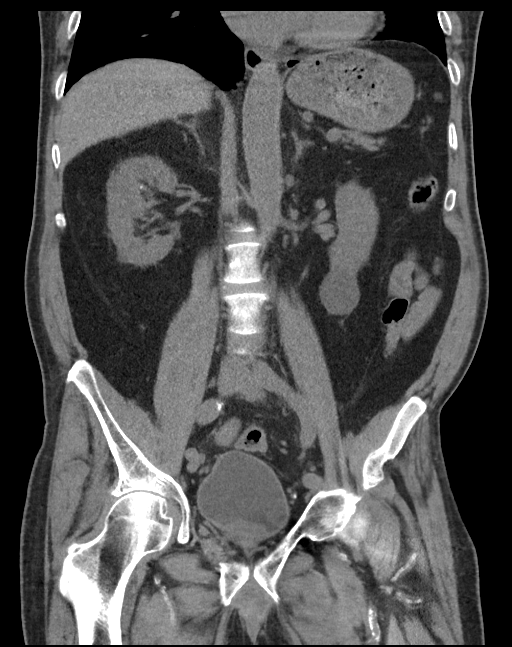

[16 of 46 positions shown; findings below may reference images not displayed]

FINDINGS: Lower chest: Cardiomegaly.  Left base atelectasis.  No effusions.

Hepatobiliary: Numerous layering gallstones within the gallbladder.
2.2 cm cyst in the left hepatic lobe.

Pancreas: No focal abnormality or ductal dilatation.

Spleen: No focal abnormality.  Normal size.

Adrenals/Urinary Tract: Numerous low-density lesions throughout the
kidneys bilaterally difficult to characterize without IV contrast.
No ureteral stones or hydronephrosis. Urinary bladder and adrenal
glands unremarkable.

Stomach/Bowel: Large left inguinal hernia containing sigmoid colon.
No evidence of bowel obstruction. Normal appendix. Appendix located
within a right inguinal hernia. Stomach and small bowel
decompressed, unremarkable.

Vascular/Lymphatic: Aortic atherosclerosis. No enlarged abdominal or
pelvic lymph nodes.

Reproductive: Prostate enlargement.

Other: No free fluid or free air.

Musculoskeletal: No acute bony abnormality.
IMPRESSION: Bilateral inguinal hernias, left larger than right. The sigmoid
colon is within the left inguinal hernia and the appendix is within
the right inguinal hernia. No evidence of bowel obstruction.

Cholelithiasis.

No renal or ureteral stones or hydronephrosis. Bilateral low-density
lesions within the kidneys likely reflects cysts but cannot be
characterized without IV contrast.

Left base atelectasis.

Aortic atherosclerosis.

Prostate enlargement.

## 2021-04-11 DIAGNOSIS — N2581 Secondary hyperparathyroidism of renal origin: Secondary | ICD-10-CM | POA: Diagnosis not present

## 2021-04-11 DIAGNOSIS — D631 Anemia in chronic kidney disease: Secondary | ICD-10-CM | POA: Diagnosis not present

## 2021-04-11 DIAGNOSIS — D509 Iron deficiency anemia, unspecified: Secondary | ICD-10-CM | POA: Diagnosis not present

## 2021-04-11 DIAGNOSIS — N186 End stage renal disease: Secondary | ICD-10-CM | POA: Diagnosis not present

## 2021-04-13 DIAGNOSIS — D509 Iron deficiency anemia, unspecified: Secondary | ICD-10-CM | POA: Diagnosis not present

## 2021-04-13 DIAGNOSIS — N186 End stage renal disease: Secondary | ICD-10-CM | POA: Diagnosis not present

## 2021-04-13 DIAGNOSIS — D631 Anemia in chronic kidney disease: Secondary | ICD-10-CM | POA: Diagnosis not present

## 2021-04-13 DIAGNOSIS — N2581 Secondary hyperparathyroidism of renal origin: Secondary | ICD-10-CM | POA: Diagnosis not present

## 2021-04-15 DIAGNOSIS — D509 Iron deficiency anemia, unspecified: Secondary | ICD-10-CM | POA: Diagnosis not present

## 2021-04-15 DIAGNOSIS — N186 End stage renal disease: Secondary | ICD-10-CM | POA: Diagnosis not present

## 2021-04-15 DIAGNOSIS — N2581 Secondary hyperparathyroidism of renal origin: Secondary | ICD-10-CM | POA: Diagnosis not present

## 2021-04-15 DIAGNOSIS — D631 Anemia in chronic kidney disease: Secondary | ICD-10-CM | POA: Diagnosis not present

## 2021-04-18 DIAGNOSIS — N2581 Secondary hyperparathyroidism of renal origin: Secondary | ICD-10-CM | POA: Diagnosis not present

## 2021-04-18 DIAGNOSIS — N186 End stage renal disease: Secondary | ICD-10-CM | POA: Diagnosis not present

## 2021-04-18 DIAGNOSIS — D509 Iron deficiency anemia, unspecified: Secondary | ICD-10-CM | POA: Diagnosis not present

## 2021-04-18 DIAGNOSIS — D631 Anemia in chronic kidney disease: Secondary | ICD-10-CM | POA: Diagnosis not present

## 2021-04-20 DIAGNOSIS — N186 End stage renal disease: Secondary | ICD-10-CM | POA: Diagnosis not present

## 2021-04-20 DIAGNOSIS — D509 Iron deficiency anemia, unspecified: Secondary | ICD-10-CM | POA: Diagnosis not present

## 2021-04-20 DIAGNOSIS — D631 Anemia in chronic kidney disease: Secondary | ICD-10-CM | POA: Diagnosis not present

## 2021-04-20 DIAGNOSIS — N2581 Secondary hyperparathyroidism of renal origin: Secondary | ICD-10-CM | POA: Diagnosis not present

## 2021-04-22 DIAGNOSIS — D631 Anemia in chronic kidney disease: Secondary | ICD-10-CM | POA: Diagnosis not present

## 2021-04-22 DIAGNOSIS — N2581 Secondary hyperparathyroidism of renal origin: Secondary | ICD-10-CM | POA: Diagnosis not present

## 2021-04-22 DIAGNOSIS — N186 End stage renal disease: Secondary | ICD-10-CM | POA: Diagnosis not present

## 2021-04-22 DIAGNOSIS — D509 Iron deficiency anemia, unspecified: Secondary | ICD-10-CM | POA: Diagnosis not present

## 2021-04-25 DIAGNOSIS — N186 End stage renal disease: Secondary | ICD-10-CM | POA: Diagnosis not present

## 2021-04-25 DIAGNOSIS — N2581 Secondary hyperparathyroidism of renal origin: Secondary | ICD-10-CM | POA: Diagnosis not present

## 2021-04-25 DIAGNOSIS — D631 Anemia in chronic kidney disease: Secondary | ICD-10-CM | POA: Diagnosis not present

## 2021-04-25 DIAGNOSIS — D509 Iron deficiency anemia, unspecified: Secondary | ICD-10-CM | POA: Diagnosis not present

## 2021-04-26 DIAGNOSIS — J90 Pleural effusion, not elsewhere classified: Secondary | ICD-10-CM | POA: Diagnosis not present

## 2021-04-26 DIAGNOSIS — Z79899 Other long term (current) drug therapy: Secondary | ICD-10-CM | POA: Diagnosis not present

## 2021-04-26 DIAGNOSIS — N186 End stage renal disease: Secondary | ICD-10-CM | POA: Diagnosis not present

## 2021-04-26 DIAGNOSIS — J9811 Atelectasis: Secondary | ICD-10-CM | POA: Diagnosis not present

## 2021-04-26 DIAGNOSIS — T82858A Stenosis of vascular prosthetic devices, implants and grafts, initial encounter: Secondary | ICD-10-CM | POA: Diagnosis not present

## 2021-04-26 DIAGNOSIS — Z992 Dependence on renal dialysis: Secondary | ICD-10-CM | POA: Diagnosis not present

## 2021-04-27 DIAGNOSIS — N2581 Secondary hyperparathyroidism of renal origin: Secondary | ICD-10-CM | POA: Diagnosis not present

## 2021-04-27 DIAGNOSIS — D631 Anemia in chronic kidney disease: Secondary | ICD-10-CM | POA: Diagnosis not present

## 2021-04-27 DIAGNOSIS — N186 End stage renal disease: Secondary | ICD-10-CM | POA: Diagnosis not present

## 2021-04-27 DIAGNOSIS — D509 Iron deficiency anemia, unspecified: Secondary | ICD-10-CM | POA: Diagnosis not present

## 2021-04-29 DIAGNOSIS — N186 End stage renal disease: Secondary | ICD-10-CM | POA: Diagnosis not present

## 2021-04-29 DIAGNOSIS — D631 Anemia in chronic kidney disease: Secondary | ICD-10-CM | POA: Diagnosis not present

## 2021-04-29 DIAGNOSIS — D509 Iron deficiency anemia, unspecified: Secondary | ICD-10-CM | POA: Diagnosis not present

## 2021-04-29 DIAGNOSIS — N2581 Secondary hyperparathyroidism of renal origin: Secondary | ICD-10-CM | POA: Diagnosis not present

## 2021-05-02 DIAGNOSIS — N186 End stage renal disease: Secondary | ICD-10-CM | POA: Diagnosis not present

## 2021-05-02 DIAGNOSIS — N2581 Secondary hyperparathyroidism of renal origin: Secondary | ICD-10-CM | POA: Diagnosis not present

## 2021-05-02 DIAGNOSIS — D631 Anemia in chronic kidney disease: Secondary | ICD-10-CM | POA: Diagnosis not present

## 2021-05-02 DIAGNOSIS — Z20822 Contact with and (suspected) exposure to covid-19: Secondary | ICD-10-CM | POA: Diagnosis not present

## 2021-05-02 DIAGNOSIS — D509 Iron deficiency anemia, unspecified: Secondary | ICD-10-CM | POA: Diagnosis not present

## 2021-05-03 DIAGNOSIS — Z992 Dependence on renal dialysis: Secondary | ICD-10-CM | POA: Diagnosis not present

## 2021-05-03 DIAGNOSIS — N186 End stage renal disease: Secondary | ICD-10-CM | POA: Diagnosis not present

## 2022-03-08 DIAGNOSIS — E1122 Type 2 diabetes mellitus with diabetic chronic kidney disease: Secondary | ICD-10-CM | POA: Diagnosis not present

## 2022-03-08 DIAGNOSIS — D631 Anemia in chronic kidney disease: Secondary | ICD-10-CM | POA: Diagnosis not present

## 2022-03-08 DIAGNOSIS — N186 End stage renal disease: Secondary | ICD-10-CM | POA: Diagnosis not present

## 2022-03-08 DIAGNOSIS — D509 Iron deficiency anemia, unspecified: Secondary | ICD-10-CM | POA: Diagnosis not present

## 2022-03-08 DIAGNOSIS — N2581 Secondary hyperparathyroidism of renal origin: Secondary | ICD-10-CM | POA: Diagnosis not present

## 2022-03-10 DIAGNOSIS — D509 Iron deficiency anemia, unspecified: Secondary | ICD-10-CM | POA: Diagnosis not present

## 2022-03-10 DIAGNOSIS — N186 End stage renal disease: Secondary | ICD-10-CM | POA: Diagnosis not present

## 2022-03-10 DIAGNOSIS — N2581 Secondary hyperparathyroidism of renal origin: Secondary | ICD-10-CM | POA: Diagnosis not present

## 2022-03-10 DIAGNOSIS — D631 Anemia in chronic kidney disease: Secondary | ICD-10-CM | POA: Diagnosis not present

## 2022-03-13 DIAGNOSIS — D631 Anemia in chronic kidney disease: Secondary | ICD-10-CM | POA: Diagnosis not present

## 2022-03-13 DIAGNOSIS — N186 End stage renal disease: Secondary | ICD-10-CM | POA: Diagnosis not present

## 2022-03-13 DIAGNOSIS — D509 Iron deficiency anemia, unspecified: Secondary | ICD-10-CM | POA: Diagnosis not present

## 2022-03-13 DIAGNOSIS — N2581 Secondary hyperparathyroidism of renal origin: Secondary | ICD-10-CM | POA: Diagnosis not present

## 2022-03-15 DIAGNOSIS — N186 End stage renal disease: Secondary | ICD-10-CM | POA: Diagnosis not present

## 2022-03-15 DIAGNOSIS — D631 Anemia in chronic kidney disease: Secondary | ICD-10-CM | POA: Diagnosis not present

## 2022-03-15 DIAGNOSIS — N2581 Secondary hyperparathyroidism of renal origin: Secondary | ICD-10-CM | POA: Diagnosis not present

## 2022-03-15 DIAGNOSIS — D509 Iron deficiency anemia, unspecified: Secondary | ICD-10-CM | POA: Diagnosis not present

## 2022-03-18 DIAGNOSIS — D631 Anemia in chronic kidney disease: Secondary | ICD-10-CM | POA: Diagnosis not present

## 2022-03-18 DIAGNOSIS — N2581 Secondary hyperparathyroidism of renal origin: Secondary | ICD-10-CM | POA: Diagnosis not present

## 2022-03-18 DIAGNOSIS — D509 Iron deficiency anemia, unspecified: Secondary | ICD-10-CM | POA: Diagnosis not present

## 2022-03-18 DIAGNOSIS — N186 End stage renal disease: Secondary | ICD-10-CM | POA: Diagnosis not present

## 2022-03-21 DIAGNOSIS — N186 End stage renal disease: Secondary | ICD-10-CM | POA: Diagnosis not present

## 2022-03-21 DIAGNOSIS — N2581 Secondary hyperparathyroidism of renal origin: Secondary | ICD-10-CM | POA: Diagnosis not present

## 2022-03-21 DIAGNOSIS — D631 Anemia in chronic kidney disease: Secondary | ICD-10-CM | POA: Diagnosis not present

## 2022-03-21 DIAGNOSIS — D509 Iron deficiency anemia, unspecified: Secondary | ICD-10-CM | POA: Diagnosis not present

## 2022-03-22 DIAGNOSIS — N2581 Secondary hyperparathyroidism of renal origin: Secondary | ICD-10-CM | POA: Diagnosis not present

## 2022-03-22 DIAGNOSIS — N186 End stage renal disease: Secondary | ICD-10-CM | POA: Diagnosis not present

## 2022-03-22 DIAGNOSIS — D631 Anemia in chronic kidney disease: Secondary | ICD-10-CM | POA: Diagnosis not present

## 2022-03-22 DIAGNOSIS — D509 Iron deficiency anemia, unspecified: Secondary | ICD-10-CM | POA: Diagnosis not present

## 2022-03-24 DIAGNOSIS — N186 End stage renal disease: Secondary | ICD-10-CM | POA: Diagnosis not present

## 2022-03-24 DIAGNOSIS — D509 Iron deficiency anemia, unspecified: Secondary | ICD-10-CM | POA: Diagnosis not present

## 2022-03-24 DIAGNOSIS — N2581 Secondary hyperparathyroidism of renal origin: Secondary | ICD-10-CM | POA: Diagnosis not present

## 2022-03-24 DIAGNOSIS — D631 Anemia in chronic kidney disease: Secondary | ICD-10-CM | POA: Diagnosis not present

## 2022-03-27 DIAGNOSIS — D631 Anemia in chronic kidney disease: Secondary | ICD-10-CM | POA: Diagnosis not present

## 2022-03-27 DIAGNOSIS — D509 Iron deficiency anemia, unspecified: Secondary | ICD-10-CM | POA: Diagnosis not present

## 2022-03-27 DIAGNOSIS — N186 End stage renal disease: Secondary | ICD-10-CM | POA: Diagnosis not present

## 2022-03-27 DIAGNOSIS — N2581 Secondary hyperparathyroidism of renal origin: Secondary | ICD-10-CM | POA: Diagnosis not present

## 2022-03-29 DIAGNOSIS — D509 Iron deficiency anemia, unspecified: Secondary | ICD-10-CM | POA: Diagnosis not present

## 2022-03-29 DIAGNOSIS — N2581 Secondary hyperparathyroidism of renal origin: Secondary | ICD-10-CM | POA: Diagnosis not present

## 2022-03-29 DIAGNOSIS — N186 End stage renal disease: Secondary | ICD-10-CM | POA: Diagnosis not present

## 2022-03-29 DIAGNOSIS — D631 Anemia in chronic kidney disease: Secondary | ICD-10-CM | POA: Diagnosis not present

## 2022-03-31 DIAGNOSIS — D509 Iron deficiency anemia, unspecified: Secondary | ICD-10-CM | POA: Diagnosis not present

## 2022-03-31 DIAGNOSIS — N2581 Secondary hyperparathyroidism of renal origin: Secondary | ICD-10-CM | POA: Diagnosis not present

## 2022-03-31 DIAGNOSIS — D631 Anemia in chronic kidney disease: Secondary | ICD-10-CM | POA: Diagnosis not present

## 2022-03-31 DIAGNOSIS — N186 End stage renal disease: Secondary | ICD-10-CM | POA: Diagnosis not present

## 2022-04-03 DIAGNOSIS — D509 Iron deficiency anemia, unspecified: Secondary | ICD-10-CM | POA: Diagnosis not present

## 2022-04-03 DIAGNOSIS — N2581 Secondary hyperparathyroidism of renal origin: Secondary | ICD-10-CM | POA: Diagnosis not present

## 2022-04-03 DIAGNOSIS — N186 End stage renal disease: Secondary | ICD-10-CM | POA: Diagnosis not present

## 2022-04-03 DIAGNOSIS — D631 Anemia in chronic kidney disease: Secondary | ICD-10-CM | POA: Diagnosis not present

## 2022-04-05 DIAGNOSIS — D631 Anemia in chronic kidney disease: Secondary | ICD-10-CM | POA: Diagnosis not present

## 2022-04-05 DIAGNOSIS — N2581 Secondary hyperparathyroidism of renal origin: Secondary | ICD-10-CM | POA: Diagnosis not present

## 2022-04-05 DIAGNOSIS — N186 End stage renal disease: Secondary | ICD-10-CM | POA: Diagnosis not present

## 2022-04-05 DIAGNOSIS — D509 Iron deficiency anemia, unspecified: Secondary | ICD-10-CM | POA: Diagnosis not present

## 2022-04-07 DIAGNOSIS — D509 Iron deficiency anemia, unspecified: Secondary | ICD-10-CM | POA: Diagnosis not present

## 2022-04-07 DIAGNOSIS — N2581 Secondary hyperparathyroidism of renal origin: Secondary | ICD-10-CM | POA: Diagnosis not present

## 2022-04-07 DIAGNOSIS — D631 Anemia in chronic kidney disease: Secondary | ICD-10-CM | POA: Diagnosis not present

## 2022-04-07 DIAGNOSIS — N186 End stage renal disease: Secondary | ICD-10-CM | POA: Diagnosis not present

## 2022-04-10 DIAGNOSIS — N186 End stage renal disease: Secondary | ICD-10-CM | POA: Diagnosis not present

## 2022-04-10 DIAGNOSIS — D509 Iron deficiency anemia, unspecified: Secondary | ICD-10-CM | POA: Diagnosis not present

## 2022-04-10 DIAGNOSIS — D631 Anemia in chronic kidney disease: Secondary | ICD-10-CM | POA: Diagnosis not present

## 2022-04-10 DIAGNOSIS — N2581 Secondary hyperparathyroidism of renal origin: Secondary | ICD-10-CM | POA: Diagnosis not present

## 2022-04-12 DIAGNOSIS — D631 Anemia in chronic kidney disease: Secondary | ICD-10-CM | POA: Diagnosis not present

## 2022-04-12 DIAGNOSIS — E1122 Type 2 diabetes mellitus with diabetic chronic kidney disease: Secondary | ICD-10-CM | POA: Diagnosis not present

## 2022-04-12 DIAGNOSIS — N2581 Secondary hyperparathyroidism of renal origin: Secondary | ICD-10-CM | POA: Diagnosis not present

## 2022-04-12 DIAGNOSIS — N186 End stage renal disease: Secondary | ICD-10-CM | POA: Diagnosis not present

## 2022-04-12 DIAGNOSIS — D509 Iron deficiency anemia, unspecified: Secondary | ICD-10-CM | POA: Diagnosis not present

## 2022-04-14 DIAGNOSIS — D631 Anemia in chronic kidney disease: Secondary | ICD-10-CM | POA: Diagnosis not present

## 2022-04-14 DIAGNOSIS — N186 End stage renal disease: Secondary | ICD-10-CM | POA: Diagnosis not present

## 2022-04-14 DIAGNOSIS — D509 Iron deficiency anemia, unspecified: Secondary | ICD-10-CM | POA: Diagnosis not present

## 2022-04-14 DIAGNOSIS — N2581 Secondary hyperparathyroidism of renal origin: Secondary | ICD-10-CM | POA: Diagnosis not present

## 2022-04-17 DIAGNOSIS — D509 Iron deficiency anemia, unspecified: Secondary | ICD-10-CM | POA: Diagnosis not present

## 2022-04-17 DIAGNOSIS — N186 End stage renal disease: Secondary | ICD-10-CM | POA: Diagnosis not present

## 2022-04-17 DIAGNOSIS — D631 Anemia in chronic kidney disease: Secondary | ICD-10-CM | POA: Diagnosis not present

## 2022-04-17 DIAGNOSIS — N2581 Secondary hyperparathyroidism of renal origin: Secondary | ICD-10-CM | POA: Diagnosis not present

## 2022-04-19 DIAGNOSIS — D631 Anemia in chronic kidney disease: Secondary | ICD-10-CM | POA: Diagnosis not present

## 2022-04-19 DIAGNOSIS — D509 Iron deficiency anemia, unspecified: Secondary | ICD-10-CM | POA: Diagnosis not present

## 2022-04-19 DIAGNOSIS — N2581 Secondary hyperparathyroidism of renal origin: Secondary | ICD-10-CM | POA: Diagnosis not present

## 2022-04-19 DIAGNOSIS — N186 End stage renal disease: Secondary | ICD-10-CM | POA: Diagnosis not present

## 2022-04-21 DIAGNOSIS — N186 End stage renal disease: Secondary | ICD-10-CM | POA: Diagnosis not present

## 2022-04-21 DIAGNOSIS — D631 Anemia in chronic kidney disease: Secondary | ICD-10-CM | POA: Diagnosis not present

## 2022-04-21 DIAGNOSIS — N2581 Secondary hyperparathyroidism of renal origin: Secondary | ICD-10-CM | POA: Diagnosis not present

## 2022-04-21 DIAGNOSIS — D509 Iron deficiency anemia, unspecified: Secondary | ICD-10-CM | POA: Diagnosis not present

## 2022-04-24 DIAGNOSIS — N186 End stage renal disease: Secondary | ICD-10-CM | POA: Diagnosis not present

## 2022-04-24 DIAGNOSIS — D509 Iron deficiency anemia, unspecified: Secondary | ICD-10-CM | POA: Diagnosis not present

## 2022-04-24 DIAGNOSIS — D631 Anemia in chronic kidney disease: Secondary | ICD-10-CM | POA: Diagnosis not present

## 2022-04-24 DIAGNOSIS — N2581 Secondary hyperparathyroidism of renal origin: Secondary | ICD-10-CM | POA: Diagnosis not present

## 2022-04-26 DIAGNOSIS — D631 Anemia in chronic kidney disease: Secondary | ICD-10-CM | POA: Diagnosis not present

## 2022-04-26 DIAGNOSIS — N2581 Secondary hyperparathyroidism of renal origin: Secondary | ICD-10-CM | POA: Diagnosis not present

## 2022-04-26 DIAGNOSIS — D509 Iron deficiency anemia, unspecified: Secondary | ICD-10-CM | POA: Diagnosis not present

## 2022-04-26 DIAGNOSIS — N186 End stage renal disease: Secondary | ICD-10-CM | POA: Diagnosis not present

## 2022-04-28 DIAGNOSIS — D509 Iron deficiency anemia, unspecified: Secondary | ICD-10-CM | POA: Diagnosis not present

## 2022-04-28 DIAGNOSIS — N2581 Secondary hyperparathyroidism of renal origin: Secondary | ICD-10-CM | POA: Diagnosis not present

## 2022-04-28 DIAGNOSIS — D631 Anemia in chronic kidney disease: Secondary | ICD-10-CM | POA: Diagnosis not present

## 2022-04-28 DIAGNOSIS — N186 End stage renal disease: Secondary | ICD-10-CM | POA: Diagnosis not present

## 2022-05-01 DIAGNOSIS — D631 Anemia in chronic kidney disease: Secondary | ICD-10-CM | POA: Diagnosis not present

## 2022-05-01 DIAGNOSIS — D509 Iron deficiency anemia, unspecified: Secondary | ICD-10-CM | POA: Diagnosis not present

## 2022-05-01 DIAGNOSIS — N2581 Secondary hyperparathyroidism of renal origin: Secondary | ICD-10-CM | POA: Diagnosis not present

## 2022-05-01 DIAGNOSIS — N186 End stage renal disease: Secondary | ICD-10-CM | POA: Diagnosis not present

## 2022-05-03 DIAGNOSIS — N186 End stage renal disease: Secondary | ICD-10-CM | POA: Diagnosis not present

## 2022-05-03 DIAGNOSIS — D509 Iron deficiency anemia, unspecified: Secondary | ICD-10-CM | POA: Diagnosis not present

## 2022-05-03 DIAGNOSIS — D631 Anemia in chronic kidney disease: Secondary | ICD-10-CM | POA: Diagnosis not present

## 2022-05-03 DIAGNOSIS — N2581 Secondary hyperparathyroidism of renal origin: Secondary | ICD-10-CM | POA: Diagnosis not present

## 2022-05-05 DIAGNOSIS — D509 Iron deficiency anemia, unspecified: Secondary | ICD-10-CM | POA: Diagnosis not present

## 2022-05-05 DIAGNOSIS — N2581 Secondary hyperparathyroidism of renal origin: Secondary | ICD-10-CM | POA: Diagnosis not present

## 2022-05-05 DIAGNOSIS — N186 End stage renal disease: Secondary | ICD-10-CM | POA: Diagnosis not present

## 2022-05-08 DIAGNOSIS — N2581 Secondary hyperparathyroidism of renal origin: Secondary | ICD-10-CM | POA: Diagnosis not present

## 2022-05-08 DIAGNOSIS — N186 End stage renal disease: Secondary | ICD-10-CM | POA: Diagnosis not present

## 2022-05-08 DIAGNOSIS — D509 Iron deficiency anemia, unspecified: Secondary | ICD-10-CM | POA: Diagnosis not present

## 2022-05-10 DIAGNOSIS — N2581 Secondary hyperparathyroidism of renal origin: Secondary | ICD-10-CM | POA: Diagnosis not present

## 2022-05-10 DIAGNOSIS — D509 Iron deficiency anemia, unspecified: Secondary | ICD-10-CM | POA: Diagnosis not present

## 2022-05-10 DIAGNOSIS — N186 End stage renal disease: Secondary | ICD-10-CM | POA: Diagnosis not present

## 2022-05-10 DIAGNOSIS — E1122 Type 2 diabetes mellitus with diabetic chronic kidney disease: Secondary | ICD-10-CM | POA: Diagnosis not present

## 2022-05-12 DIAGNOSIS — D509 Iron deficiency anemia, unspecified: Secondary | ICD-10-CM | POA: Diagnosis not present

## 2022-05-12 DIAGNOSIS — N186 End stage renal disease: Secondary | ICD-10-CM | POA: Diagnosis not present

## 2022-05-12 DIAGNOSIS — N2581 Secondary hyperparathyroidism of renal origin: Secondary | ICD-10-CM | POA: Diagnosis not present

## 2022-05-15 DIAGNOSIS — N186 End stage renal disease: Secondary | ICD-10-CM | POA: Diagnosis not present

## 2022-05-15 DIAGNOSIS — N2581 Secondary hyperparathyroidism of renal origin: Secondary | ICD-10-CM | POA: Diagnosis not present

## 2022-05-15 DIAGNOSIS — D509 Iron deficiency anemia, unspecified: Secondary | ICD-10-CM | POA: Diagnosis not present

## 2022-05-17 DIAGNOSIS — D509 Iron deficiency anemia, unspecified: Secondary | ICD-10-CM | POA: Diagnosis not present

## 2022-05-17 DIAGNOSIS — N2581 Secondary hyperparathyroidism of renal origin: Secondary | ICD-10-CM | POA: Diagnosis not present

## 2022-05-17 DIAGNOSIS — N186 End stage renal disease: Secondary | ICD-10-CM | POA: Diagnosis not present

## 2022-05-19 DIAGNOSIS — D509 Iron deficiency anemia, unspecified: Secondary | ICD-10-CM | POA: Diagnosis not present

## 2022-05-19 DIAGNOSIS — N2581 Secondary hyperparathyroidism of renal origin: Secondary | ICD-10-CM | POA: Diagnosis not present

## 2022-05-19 DIAGNOSIS — N186 End stage renal disease: Secondary | ICD-10-CM | POA: Diagnosis not present

## 2022-05-22 DIAGNOSIS — N2581 Secondary hyperparathyroidism of renal origin: Secondary | ICD-10-CM | POA: Diagnosis not present

## 2022-05-22 DIAGNOSIS — D509 Iron deficiency anemia, unspecified: Secondary | ICD-10-CM | POA: Diagnosis not present

## 2022-05-22 DIAGNOSIS — N186 End stage renal disease: Secondary | ICD-10-CM | POA: Diagnosis not present

## 2022-05-24 DIAGNOSIS — N2581 Secondary hyperparathyroidism of renal origin: Secondary | ICD-10-CM | POA: Diagnosis not present

## 2022-05-24 DIAGNOSIS — N186 End stage renal disease: Secondary | ICD-10-CM | POA: Diagnosis not present

## 2022-05-24 DIAGNOSIS — D509 Iron deficiency anemia, unspecified: Secondary | ICD-10-CM | POA: Diagnosis not present

## 2022-05-26 DIAGNOSIS — N2581 Secondary hyperparathyroidism of renal origin: Secondary | ICD-10-CM | POA: Diagnosis not present

## 2022-05-26 DIAGNOSIS — D509 Iron deficiency anemia, unspecified: Secondary | ICD-10-CM | POA: Diagnosis not present

## 2022-05-26 DIAGNOSIS — N186 End stage renal disease: Secondary | ICD-10-CM | POA: Diagnosis not present

## 2022-05-29 DIAGNOSIS — N186 End stage renal disease: Secondary | ICD-10-CM | POA: Diagnosis not present

## 2022-05-29 DIAGNOSIS — D509 Iron deficiency anemia, unspecified: Secondary | ICD-10-CM | POA: Diagnosis not present

## 2022-05-29 DIAGNOSIS — Z1159 Encounter for screening for other viral diseases: Secondary | ICD-10-CM | POA: Diagnosis not present

## 2022-05-29 DIAGNOSIS — N2581 Secondary hyperparathyroidism of renal origin: Secondary | ICD-10-CM | POA: Diagnosis not present

## 2022-05-31 DIAGNOSIS — D509 Iron deficiency anemia, unspecified: Secondary | ICD-10-CM | POA: Diagnosis not present

## 2022-05-31 DIAGNOSIS — N186 End stage renal disease: Secondary | ICD-10-CM | POA: Diagnosis not present

## 2022-05-31 DIAGNOSIS — N2581 Secondary hyperparathyroidism of renal origin: Secondary | ICD-10-CM | POA: Diagnosis not present

## 2022-06-02 DIAGNOSIS — D509 Iron deficiency anemia, unspecified: Secondary | ICD-10-CM | POA: Diagnosis not present

## 2022-06-02 DIAGNOSIS — N186 End stage renal disease: Secondary | ICD-10-CM | POA: Diagnosis not present

## 2022-06-02 DIAGNOSIS — N2581 Secondary hyperparathyroidism of renal origin: Secondary | ICD-10-CM | POA: Diagnosis not present

## 2022-06-04 DIAGNOSIS — N186 End stage renal disease: Secondary | ICD-10-CM | POA: Diagnosis not present

## 2022-06-04 DIAGNOSIS — Z992 Dependence on renal dialysis: Secondary | ICD-10-CM | POA: Diagnosis not present

## 2022-06-05 DIAGNOSIS — D631 Anemia in chronic kidney disease: Secondary | ICD-10-CM | POA: Diagnosis not present

## 2022-06-05 DIAGNOSIS — N2581 Secondary hyperparathyroidism of renal origin: Secondary | ICD-10-CM | POA: Diagnosis not present

## 2022-06-05 DIAGNOSIS — N186 End stage renal disease: Secondary | ICD-10-CM | POA: Diagnosis not present

## 2022-06-05 DIAGNOSIS — D509 Iron deficiency anemia, unspecified: Secondary | ICD-10-CM | POA: Diagnosis not present

## 2022-06-07 DIAGNOSIS — D631 Anemia in chronic kidney disease: Secondary | ICD-10-CM | POA: Diagnosis not present

## 2022-06-07 DIAGNOSIS — D509 Iron deficiency anemia, unspecified: Secondary | ICD-10-CM | POA: Diagnosis not present

## 2022-06-07 DIAGNOSIS — N186 End stage renal disease: Secondary | ICD-10-CM | POA: Diagnosis not present

## 2022-06-07 DIAGNOSIS — E1122 Type 2 diabetes mellitus with diabetic chronic kidney disease: Secondary | ICD-10-CM | POA: Diagnosis not present

## 2022-06-07 DIAGNOSIS — N2581 Secondary hyperparathyroidism of renal origin: Secondary | ICD-10-CM | POA: Diagnosis not present

## 2022-06-09 DIAGNOSIS — N2581 Secondary hyperparathyroidism of renal origin: Secondary | ICD-10-CM | POA: Diagnosis not present

## 2022-06-09 DIAGNOSIS — D509 Iron deficiency anemia, unspecified: Secondary | ICD-10-CM | POA: Diagnosis not present

## 2022-06-09 DIAGNOSIS — D631 Anemia in chronic kidney disease: Secondary | ICD-10-CM | POA: Diagnosis not present

## 2022-06-09 DIAGNOSIS — N186 End stage renal disease: Secondary | ICD-10-CM | POA: Diagnosis not present

## 2022-06-12 DIAGNOSIS — N186 End stage renal disease: Secondary | ICD-10-CM | POA: Diagnosis not present

## 2022-06-12 DIAGNOSIS — D631 Anemia in chronic kidney disease: Secondary | ICD-10-CM | POA: Diagnosis not present

## 2022-06-12 DIAGNOSIS — N2581 Secondary hyperparathyroidism of renal origin: Secondary | ICD-10-CM | POA: Diagnosis not present

## 2022-06-12 DIAGNOSIS — D509 Iron deficiency anemia, unspecified: Secondary | ICD-10-CM | POA: Diagnosis not present

## 2022-06-14 DIAGNOSIS — N186 End stage renal disease: Secondary | ICD-10-CM | POA: Diagnosis not present

## 2022-06-14 DIAGNOSIS — N2581 Secondary hyperparathyroidism of renal origin: Secondary | ICD-10-CM | POA: Diagnosis not present

## 2022-06-14 DIAGNOSIS — D509 Iron deficiency anemia, unspecified: Secondary | ICD-10-CM | POA: Diagnosis not present

## 2022-06-14 DIAGNOSIS — D631 Anemia in chronic kidney disease: Secondary | ICD-10-CM | POA: Diagnosis not present

## 2022-06-15 DIAGNOSIS — E7800 Pure hypercholesterolemia, unspecified: Secondary | ICD-10-CM | POA: Diagnosis not present

## 2022-06-15 DIAGNOSIS — R627 Adult failure to thrive: Secondary | ICD-10-CM | POA: Diagnosis not present

## 2022-06-15 DIAGNOSIS — E559 Vitamin D deficiency, unspecified: Secondary | ICD-10-CM | POA: Diagnosis not present

## 2022-06-15 DIAGNOSIS — Z0001 Encounter for general adult medical examination with abnormal findings: Secondary | ICD-10-CM | POA: Diagnosis not present

## 2022-06-15 DIAGNOSIS — D508 Other iron deficiency anemias: Secondary | ICD-10-CM | POA: Diagnosis not present

## 2022-06-15 DIAGNOSIS — E119 Type 2 diabetes mellitus without complications: Secondary | ICD-10-CM | POA: Diagnosis not present

## 2022-06-15 DIAGNOSIS — I10 Essential (primary) hypertension: Secondary | ICD-10-CM | POA: Diagnosis not present

## 2022-06-15 DIAGNOSIS — Z125 Encounter for screening for malignant neoplasm of prostate: Secondary | ICD-10-CM | POA: Diagnosis not present

## 2022-06-15 DIAGNOSIS — F329 Major depressive disorder, single episode, unspecified: Secondary | ICD-10-CM | POA: Diagnosis not present

## 2022-06-16 DIAGNOSIS — N186 End stage renal disease: Secondary | ICD-10-CM | POA: Diagnosis not present

## 2022-06-16 DIAGNOSIS — D631 Anemia in chronic kidney disease: Secondary | ICD-10-CM | POA: Diagnosis not present

## 2022-06-16 DIAGNOSIS — N2581 Secondary hyperparathyroidism of renal origin: Secondary | ICD-10-CM | POA: Diagnosis not present

## 2022-06-16 DIAGNOSIS — D509 Iron deficiency anemia, unspecified: Secondary | ICD-10-CM | POA: Diagnosis not present

## 2022-06-19 DIAGNOSIS — N2581 Secondary hyperparathyroidism of renal origin: Secondary | ICD-10-CM | POA: Diagnosis not present

## 2022-06-19 DIAGNOSIS — N186 End stage renal disease: Secondary | ICD-10-CM | POA: Diagnosis not present

## 2022-06-19 DIAGNOSIS — D631 Anemia in chronic kidney disease: Secondary | ICD-10-CM | POA: Diagnosis not present

## 2022-06-19 DIAGNOSIS — D509 Iron deficiency anemia, unspecified: Secondary | ICD-10-CM | POA: Diagnosis not present

## 2022-06-21 DIAGNOSIS — N2581 Secondary hyperparathyroidism of renal origin: Secondary | ICD-10-CM | POA: Diagnosis not present

## 2022-06-21 DIAGNOSIS — D509 Iron deficiency anemia, unspecified: Secondary | ICD-10-CM | POA: Diagnosis not present

## 2022-06-21 DIAGNOSIS — N186 End stage renal disease: Secondary | ICD-10-CM | POA: Diagnosis not present

## 2022-06-21 DIAGNOSIS — D631 Anemia in chronic kidney disease: Secondary | ICD-10-CM | POA: Diagnosis not present

## 2022-06-23 DIAGNOSIS — D631 Anemia in chronic kidney disease: Secondary | ICD-10-CM | POA: Diagnosis not present

## 2022-06-23 DIAGNOSIS — D509 Iron deficiency anemia, unspecified: Secondary | ICD-10-CM | POA: Diagnosis not present

## 2022-06-23 DIAGNOSIS — N2581 Secondary hyperparathyroidism of renal origin: Secondary | ICD-10-CM | POA: Diagnosis not present

## 2022-06-23 DIAGNOSIS — N186 End stage renal disease: Secondary | ICD-10-CM | POA: Diagnosis not present

## 2022-06-26 DIAGNOSIS — N186 End stage renal disease: Secondary | ICD-10-CM | POA: Diagnosis not present

## 2022-06-26 DIAGNOSIS — D631 Anemia in chronic kidney disease: Secondary | ICD-10-CM | POA: Diagnosis not present

## 2022-06-26 DIAGNOSIS — N2581 Secondary hyperparathyroidism of renal origin: Secondary | ICD-10-CM | POA: Diagnosis not present

## 2022-06-26 DIAGNOSIS — D509 Iron deficiency anemia, unspecified: Secondary | ICD-10-CM | POA: Diagnosis not present

## 2022-06-28 DIAGNOSIS — D631 Anemia in chronic kidney disease: Secondary | ICD-10-CM | POA: Diagnosis not present

## 2022-06-28 DIAGNOSIS — D509 Iron deficiency anemia, unspecified: Secondary | ICD-10-CM | POA: Diagnosis not present

## 2022-06-28 DIAGNOSIS — N2581 Secondary hyperparathyroidism of renal origin: Secondary | ICD-10-CM | POA: Diagnosis not present

## 2022-06-28 DIAGNOSIS — N186 End stage renal disease: Secondary | ICD-10-CM | POA: Diagnosis not present

## 2022-06-29 DIAGNOSIS — I69398 Other sequelae of cerebral infarction: Secondary | ICD-10-CM | POA: Diagnosis not present

## 2022-06-29 DIAGNOSIS — R627 Adult failure to thrive: Secondary | ICD-10-CM | POA: Diagnosis not present

## 2022-06-29 DIAGNOSIS — R41841 Cognitive communication deficit: Secondary | ICD-10-CM | POA: Diagnosis not present

## 2022-06-29 DIAGNOSIS — F0390 Unspecified dementia without behavioral disturbance: Secondary | ICD-10-CM | POA: Diagnosis not present

## 2022-06-29 DIAGNOSIS — Z789 Other specified health status: Secondary | ICD-10-CM | POA: Diagnosis not present

## 2022-06-29 DIAGNOSIS — Z7409 Other reduced mobility: Secondary | ICD-10-CM | POA: Diagnosis not present

## 2022-06-30 DIAGNOSIS — D631 Anemia in chronic kidney disease: Secondary | ICD-10-CM | POA: Diagnosis not present

## 2022-06-30 DIAGNOSIS — D509 Iron deficiency anemia, unspecified: Secondary | ICD-10-CM | POA: Diagnosis not present

## 2022-06-30 DIAGNOSIS — N2581 Secondary hyperparathyroidism of renal origin: Secondary | ICD-10-CM | POA: Diagnosis not present

## 2022-06-30 DIAGNOSIS — N186 End stage renal disease: Secondary | ICD-10-CM | POA: Diagnosis not present

## 2022-07-03 DIAGNOSIS — D631 Anemia in chronic kidney disease: Secondary | ICD-10-CM | POA: Diagnosis not present

## 2022-07-03 DIAGNOSIS — D509 Iron deficiency anemia, unspecified: Secondary | ICD-10-CM | POA: Diagnosis not present

## 2022-07-03 DIAGNOSIS — N2581 Secondary hyperparathyroidism of renal origin: Secondary | ICD-10-CM | POA: Diagnosis not present

## 2022-07-03 DIAGNOSIS — N186 End stage renal disease: Secondary | ICD-10-CM | POA: Diagnosis not present

## 2022-07-04 DIAGNOSIS — R41841 Cognitive communication deficit: Secondary | ICD-10-CM | POA: Diagnosis not present

## 2022-07-04 DIAGNOSIS — I69398 Other sequelae of cerebral infarction: Secondary | ICD-10-CM | POA: Diagnosis not present

## 2022-07-04 DIAGNOSIS — Z7409 Other reduced mobility: Secondary | ICD-10-CM | POA: Diagnosis not present

## 2022-07-04 DIAGNOSIS — R627 Adult failure to thrive: Secondary | ICD-10-CM | POA: Diagnosis not present

## 2022-07-04 DIAGNOSIS — Z789 Other specified health status: Secondary | ICD-10-CM | POA: Diagnosis not present

## 2022-07-04 DIAGNOSIS — Z992 Dependence on renal dialysis: Secondary | ICD-10-CM | POA: Diagnosis not present

## 2022-07-04 DIAGNOSIS — N186 End stage renal disease: Secondary | ICD-10-CM | POA: Diagnosis not present

## 2022-07-04 DIAGNOSIS — F0390 Unspecified dementia without behavioral disturbance: Secondary | ICD-10-CM | POA: Diagnosis not present

## 2022-07-05 DIAGNOSIS — E1122 Type 2 diabetes mellitus with diabetic chronic kidney disease: Secondary | ICD-10-CM | POA: Diagnosis not present

## 2022-07-05 DIAGNOSIS — N2581 Secondary hyperparathyroidism of renal origin: Secondary | ICD-10-CM | POA: Diagnosis not present

## 2022-07-05 DIAGNOSIS — N186 End stage renal disease: Secondary | ICD-10-CM | POA: Diagnosis not present

## 2022-07-05 DIAGNOSIS — D631 Anemia in chronic kidney disease: Secondary | ICD-10-CM | POA: Diagnosis not present

## 2022-07-05 DIAGNOSIS — D509 Iron deficiency anemia, unspecified: Secondary | ICD-10-CM | POA: Diagnosis not present

## 2022-07-07 DIAGNOSIS — N186 End stage renal disease: Secondary | ICD-10-CM | POA: Diagnosis not present

## 2022-07-07 DIAGNOSIS — N2581 Secondary hyperparathyroidism of renal origin: Secondary | ICD-10-CM | POA: Diagnosis not present

## 2022-07-07 DIAGNOSIS — D631 Anemia in chronic kidney disease: Secondary | ICD-10-CM | POA: Diagnosis not present

## 2022-07-07 DIAGNOSIS — D509 Iron deficiency anemia, unspecified: Secondary | ICD-10-CM | POA: Diagnosis not present

## 2022-07-10 DIAGNOSIS — D631 Anemia in chronic kidney disease: Secondary | ICD-10-CM | POA: Diagnosis not present

## 2022-07-10 DIAGNOSIS — N186 End stage renal disease: Secondary | ICD-10-CM | POA: Diagnosis not present

## 2022-07-10 DIAGNOSIS — D509 Iron deficiency anemia, unspecified: Secondary | ICD-10-CM | POA: Diagnosis not present

## 2022-07-10 DIAGNOSIS — N2581 Secondary hyperparathyroidism of renal origin: Secondary | ICD-10-CM | POA: Diagnosis not present

## 2022-07-12 DIAGNOSIS — D509 Iron deficiency anemia, unspecified: Secondary | ICD-10-CM | POA: Diagnosis not present

## 2022-07-12 DIAGNOSIS — N186 End stage renal disease: Secondary | ICD-10-CM | POA: Diagnosis not present

## 2022-07-12 DIAGNOSIS — D631 Anemia in chronic kidney disease: Secondary | ICD-10-CM | POA: Diagnosis not present

## 2022-07-12 DIAGNOSIS — N2581 Secondary hyperparathyroidism of renal origin: Secondary | ICD-10-CM | POA: Diagnosis not present

## 2022-07-14 DIAGNOSIS — E119 Type 2 diabetes mellitus without complications: Secondary | ICD-10-CM | POA: Diagnosis not present

## 2022-07-14 DIAGNOSIS — N2581 Secondary hyperparathyroidism of renal origin: Secondary | ICD-10-CM | POA: Diagnosis not present

## 2022-07-14 DIAGNOSIS — D631 Anemia in chronic kidney disease: Secondary | ICD-10-CM | POA: Diagnosis not present

## 2022-07-14 DIAGNOSIS — D509 Iron deficiency anemia, unspecified: Secondary | ICD-10-CM | POA: Diagnosis not present

## 2022-07-14 DIAGNOSIS — N186 End stage renal disease: Secondary | ICD-10-CM | POA: Diagnosis not present

## 2022-07-14 DIAGNOSIS — N39498 Other specified urinary incontinence: Secondary | ICD-10-CM | POA: Diagnosis not present

## 2022-07-17 DIAGNOSIS — N2581 Secondary hyperparathyroidism of renal origin: Secondary | ICD-10-CM | POA: Diagnosis not present

## 2022-07-17 DIAGNOSIS — D631 Anemia in chronic kidney disease: Secondary | ICD-10-CM | POA: Diagnosis not present

## 2022-07-17 DIAGNOSIS — D509 Iron deficiency anemia, unspecified: Secondary | ICD-10-CM | POA: Diagnosis not present

## 2022-07-17 DIAGNOSIS — N186 End stage renal disease: Secondary | ICD-10-CM | POA: Diagnosis not present

## 2022-07-18 DIAGNOSIS — I639 Cerebral infarction, unspecified: Secondary | ICD-10-CM | POA: Diagnosis not present

## 2022-07-18 DIAGNOSIS — I69398 Other sequelae of cerebral infarction: Secondary | ICD-10-CM | POA: Diagnosis not present

## 2022-07-18 DIAGNOSIS — Z789 Other specified health status: Secondary | ICD-10-CM | POA: Diagnosis not present

## 2022-07-18 DIAGNOSIS — R41841 Cognitive communication deficit: Secondary | ICD-10-CM | POA: Diagnosis not present

## 2022-07-18 DIAGNOSIS — F0390 Unspecified dementia without behavioral disturbance: Secondary | ICD-10-CM | POA: Diagnosis not present

## 2022-07-18 DIAGNOSIS — Z7409 Other reduced mobility: Secondary | ICD-10-CM | POA: Diagnosis not present

## 2022-07-18 DIAGNOSIS — R627 Adult failure to thrive: Secondary | ICD-10-CM | POA: Diagnosis not present

## 2022-07-19 DIAGNOSIS — N186 End stage renal disease: Secondary | ICD-10-CM | POA: Diagnosis not present

## 2022-07-19 DIAGNOSIS — N2581 Secondary hyperparathyroidism of renal origin: Secondary | ICD-10-CM | POA: Diagnosis not present

## 2022-07-19 DIAGNOSIS — D509 Iron deficiency anemia, unspecified: Secondary | ICD-10-CM | POA: Diagnosis not present

## 2022-07-19 DIAGNOSIS — D631 Anemia in chronic kidney disease: Secondary | ICD-10-CM | POA: Diagnosis not present

## 2022-07-20 DIAGNOSIS — R627 Adult failure to thrive: Secondary | ICD-10-CM | POA: Diagnosis not present

## 2022-07-20 DIAGNOSIS — I69398 Other sequelae of cerebral infarction: Secondary | ICD-10-CM | POA: Diagnosis not present

## 2022-07-20 DIAGNOSIS — R41841 Cognitive communication deficit: Secondary | ICD-10-CM | POA: Diagnosis not present

## 2022-07-20 DIAGNOSIS — I639 Cerebral infarction, unspecified: Secondary | ICD-10-CM | POA: Diagnosis not present

## 2022-07-20 DIAGNOSIS — F0390 Unspecified dementia without behavioral disturbance: Secondary | ICD-10-CM | POA: Diagnosis not present

## 2022-07-20 DIAGNOSIS — Z7409 Other reduced mobility: Secondary | ICD-10-CM | POA: Diagnosis not present

## 2022-07-20 DIAGNOSIS — Z789 Other specified health status: Secondary | ICD-10-CM | POA: Diagnosis not present

## 2022-07-21 DIAGNOSIS — N2581 Secondary hyperparathyroidism of renal origin: Secondary | ICD-10-CM | POA: Diagnosis not present

## 2022-07-21 DIAGNOSIS — D509 Iron deficiency anemia, unspecified: Secondary | ICD-10-CM | POA: Diagnosis not present

## 2022-07-21 DIAGNOSIS — N186 End stage renal disease: Secondary | ICD-10-CM | POA: Diagnosis not present

## 2022-07-21 DIAGNOSIS — D631 Anemia in chronic kidney disease: Secondary | ICD-10-CM | POA: Diagnosis not present

## 2022-07-24 DIAGNOSIS — N2581 Secondary hyperparathyroidism of renal origin: Secondary | ICD-10-CM | POA: Diagnosis not present

## 2022-07-24 DIAGNOSIS — D509 Iron deficiency anemia, unspecified: Secondary | ICD-10-CM | POA: Diagnosis not present

## 2022-07-24 DIAGNOSIS — D631 Anemia in chronic kidney disease: Secondary | ICD-10-CM | POA: Diagnosis not present

## 2022-07-24 DIAGNOSIS — N186 End stage renal disease: Secondary | ICD-10-CM | POA: Diagnosis not present

## 2022-07-25 DIAGNOSIS — I69398 Other sequelae of cerebral infarction: Secondary | ICD-10-CM | POA: Diagnosis not present

## 2022-07-25 DIAGNOSIS — R627 Adult failure to thrive: Secondary | ICD-10-CM | POA: Diagnosis not present

## 2022-07-25 DIAGNOSIS — Z789 Other specified health status: Secondary | ICD-10-CM | POA: Diagnosis not present

## 2022-07-25 DIAGNOSIS — R41841 Cognitive communication deficit: Secondary | ICD-10-CM | POA: Diagnosis not present

## 2022-07-25 DIAGNOSIS — I639 Cerebral infarction, unspecified: Secondary | ICD-10-CM | POA: Diagnosis not present

## 2022-07-25 DIAGNOSIS — F0390 Unspecified dementia without behavioral disturbance: Secondary | ICD-10-CM | POA: Diagnosis not present

## 2022-07-25 DIAGNOSIS — Z7409 Other reduced mobility: Secondary | ICD-10-CM | POA: Diagnosis not present

## 2022-07-26 DIAGNOSIS — N2581 Secondary hyperparathyroidism of renal origin: Secondary | ICD-10-CM | POA: Diagnosis not present

## 2022-07-26 DIAGNOSIS — D509 Iron deficiency anemia, unspecified: Secondary | ICD-10-CM | POA: Diagnosis not present

## 2022-07-26 DIAGNOSIS — N186 End stage renal disease: Secondary | ICD-10-CM | POA: Diagnosis not present

## 2022-07-26 DIAGNOSIS — D631 Anemia in chronic kidney disease: Secondary | ICD-10-CM | POA: Diagnosis not present

## 2022-07-27 DIAGNOSIS — R627 Adult failure to thrive: Secondary | ICD-10-CM | POA: Diagnosis not present

## 2022-07-27 DIAGNOSIS — Z7409 Other reduced mobility: Secondary | ICD-10-CM | POA: Diagnosis not present

## 2022-07-27 DIAGNOSIS — Z789 Other specified health status: Secondary | ICD-10-CM | POA: Diagnosis not present

## 2022-07-27 DIAGNOSIS — I639 Cerebral infarction, unspecified: Secondary | ICD-10-CM | POA: Diagnosis not present

## 2022-07-27 DIAGNOSIS — I69398 Other sequelae of cerebral infarction: Secondary | ICD-10-CM | POA: Diagnosis not present

## 2022-07-27 DIAGNOSIS — F0390 Unspecified dementia without behavioral disturbance: Secondary | ICD-10-CM | POA: Diagnosis not present

## 2022-07-27 DIAGNOSIS — R41841 Cognitive communication deficit: Secondary | ICD-10-CM | POA: Diagnosis not present

## 2022-07-28 DIAGNOSIS — N186 End stage renal disease: Secondary | ICD-10-CM | POA: Diagnosis not present

## 2022-07-28 DIAGNOSIS — D631 Anemia in chronic kidney disease: Secondary | ICD-10-CM | POA: Diagnosis not present

## 2022-07-28 DIAGNOSIS — D509 Iron deficiency anemia, unspecified: Secondary | ICD-10-CM | POA: Diagnosis not present

## 2022-07-28 DIAGNOSIS — N2581 Secondary hyperparathyroidism of renal origin: Secondary | ICD-10-CM | POA: Diagnosis not present

## 2022-07-31 DIAGNOSIS — D631 Anemia in chronic kidney disease: Secondary | ICD-10-CM | POA: Diagnosis not present

## 2022-07-31 DIAGNOSIS — N2581 Secondary hyperparathyroidism of renal origin: Secondary | ICD-10-CM | POA: Diagnosis not present

## 2022-07-31 DIAGNOSIS — D509 Iron deficiency anemia, unspecified: Secondary | ICD-10-CM | POA: Diagnosis not present

## 2022-07-31 DIAGNOSIS — N186 End stage renal disease: Secondary | ICD-10-CM | POA: Diagnosis not present

## 2022-08-02 DIAGNOSIS — N2581 Secondary hyperparathyroidism of renal origin: Secondary | ICD-10-CM | POA: Diagnosis not present

## 2022-08-02 DIAGNOSIS — D509 Iron deficiency anemia, unspecified: Secondary | ICD-10-CM | POA: Diagnosis not present

## 2022-08-02 DIAGNOSIS — D631 Anemia in chronic kidney disease: Secondary | ICD-10-CM | POA: Diagnosis not present

## 2022-08-02 DIAGNOSIS — N186 End stage renal disease: Secondary | ICD-10-CM | POA: Diagnosis not present

## 2022-08-03 DIAGNOSIS — I69398 Other sequelae of cerebral infarction: Secondary | ICD-10-CM | POA: Diagnosis not present

## 2022-08-03 DIAGNOSIS — R627 Adult failure to thrive: Secondary | ICD-10-CM | POA: Diagnosis not present

## 2022-08-03 DIAGNOSIS — F0390 Unspecified dementia without behavioral disturbance: Secondary | ICD-10-CM | POA: Diagnosis not present

## 2022-08-03 DIAGNOSIS — I639 Cerebral infarction, unspecified: Secondary | ICD-10-CM | POA: Diagnosis not present

## 2022-08-03 DIAGNOSIS — R41841 Cognitive communication deficit: Secondary | ICD-10-CM | POA: Diagnosis not present

## 2022-08-03 DIAGNOSIS — Z789 Other specified health status: Secondary | ICD-10-CM | POA: Diagnosis not present

## 2022-08-03 DIAGNOSIS — Z7409 Other reduced mobility: Secondary | ICD-10-CM | POA: Diagnosis not present

## 2022-08-04 DIAGNOSIS — Z992 Dependence on renal dialysis: Secondary | ICD-10-CM | POA: Diagnosis not present

## 2022-08-04 DIAGNOSIS — N2581 Secondary hyperparathyroidism of renal origin: Secondary | ICD-10-CM | POA: Diagnosis not present

## 2022-08-04 DIAGNOSIS — D631 Anemia in chronic kidney disease: Secondary | ICD-10-CM | POA: Diagnosis not present

## 2022-08-04 DIAGNOSIS — N186 End stage renal disease: Secondary | ICD-10-CM | POA: Diagnosis not present

## 2022-08-04 DIAGNOSIS — D509 Iron deficiency anemia, unspecified: Secondary | ICD-10-CM | POA: Diagnosis not present

## 2022-08-07 DIAGNOSIS — N186 End stage renal disease: Secondary | ICD-10-CM | POA: Diagnosis not present

## 2022-08-07 DIAGNOSIS — N2581 Secondary hyperparathyroidism of renal origin: Secondary | ICD-10-CM | POA: Diagnosis not present

## 2022-08-07 DIAGNOSIS — D509 Iron deficiency anemia, unspecified: Secondary | ICD-10-CM | POA: Diagnosis not present

## 2022-08-07 DIAGNOSIS — D631 Anemia in chronic kidney disease: Secondary | ICD-10-CM | POA: Diagnosis not present

## 2022-08-09 DIAGNOSIS — D631 Anemia in chronic kidney disease: Secondary | ICD-10-CM | POA: Diagnosis not present

## 2022-08-09 DIAGNOSIS — N2581 Secondary hyperparathyroidism of renal origin: Secondary | ICD-10-CM | POA: Diagnosis not present

## 2022-08-09 DIAGNOSIS — D509 Iron deficiency anemia, unspecified: Secondary | ICD-10-CM | POA: Diagnosis not present

## 2022-08-09 DIAGNOSIS — N186 End stage renal disease: Secondary | ICD-10-CM | POA: Diagnosis not present

## 2022-08-09 DIAGNOSIS — E1122 Type 2 diabetes mellitus with diabetic chronic kidney disease: Secondary | ICD-10-CM | POA: Diagnosis not present

## 2022-08-10 DIAGNOSIS — F0390 Unspecified dementia without behavioral disturbance: Secondary | ICD-10-CM | POA: Diagnosis not present

## 2022-08-10 DIAGNOSIS — Z7409 Other reduced mobility: Secondary | ICD-10-CM | POA: Diagnosis not present

## 2022-08-10 DIAGNOSIS — I69398 Other sequelae of cerebral infarction: Secondary | ICD-10-CM | POA: Diagnosis not present

## 2022-08-10 DIAGNOSIS — R627 Adult failure to thrive: Secondary | ICD-10-CM | POA: Diagnosis not present

## 2022-08-10 DIAGNOSIS — I639 Cerebral infarction, unspecified: Secondary | ICD-10-CM | POA: Diagnosis not present

## 2022-08-10 DIAGNOSIS — Z789 Other specified health status: Secondary | ICD-10-CM | POA: Diagnosis not present

## 2022-08-10 DIAGNOSIS — R41841 Cognitive communication deficit: Secondary | ICD-10-CM | POA: Diagnosis not present

## 2022-08-11 DIAGNOSIS — N2581 Secondary hyperparathyroidism of renal origin: Secondary | ICD-10-CM | POA: Diagnosis not present

## 2022-08-11 DIAGNOSIS — N186 End stage renal disease: Secondary | ICD-10-CM | POA: Diagnosis not present

## 2022-08-11 DIAGNOSIS — D631 Anemia in chronic kidney disease: Secondary | ICD-10-CM | POA: Diagnosis not present

## 2022-08-11 DIAGNOSIS — D509 Iron deficiency anemia, unspecified: Secondary | ICD-10-CM | POA: Diagnosis not present

## 2022-08-14 DIAGNOSIS — N2581 Secondary hyperparathyroidism of renal origin: Secondary | ICD-10-CM | POA: Diagnosis not present

## 2022-08-14 DIAGNOSIS — E119 Type 2 diabetes mellitus without complications: Secondary | ICD-10-CM | POA: Diagnosis not present

## 2022-08-14 DIAGNOSIS — N186 End stage renal disease: Secondary | ICD-10-CM | POA: Diagnosis not present

## 2022-08-14 DIAGNOSIS — N39498 Other specified urinary incontinence: Secondary | ICD-10-CM | POA: Diagnosis not present

## 2022-08-14 DIAGNOSIS — D509 Iron deficiency anemia, unspecified: Secondary | ICD-10-CM | POA: Diagnosis not present

## 2022-08-14 DIAGNOSIS — D631 Anemia in chronic kidney disease: Secondary | ICD-10-CM | POA: Diagnosis not present

## 2022-08-16 DIAGNOSIS — D509 Iron deficiency anemia, unspecified: Secondary | ICD-10-CM | POA: Diagnosis not present

## 2022-08-16 DIAGNOSIS — N186 End stage renal disease: Secondary | ICD-10-CM | POA: Diagnosis not present

## 2022-08-16 DIAGNOSIS — D631 Anemia in chronic kidney disease: Secondary | ICD-10-CM | POA: Diagnosis not present

## 2022-08-16 DIAGNOSIS — N2581 Secondary hyperparathyroidism of renal origin: Secondary | ICD-10-CM | POA: Diagnosis not present

## 2022-08-18 DIAGNOSIS — N2581 Secondary hyperparathyroidism of renal origin: Secondary | ICD-10-CM | POA: Diagnosis not present

## 2022-08-18 DIAGNOSIS — D509 Iron deficiency anemia, unspecified: Secondary | ICD-10-CM | POA: Diagnosis not present

## 2022-08-18 DIAGNOSIS — D631 Anemia in chronic kidney disease: Secondary | ICD-10-CM | POA: Diagnosis not present

## 2022-08-18 DIAGNOSIS — N186 End stage renal disease: Secondary | ICD-10-CM | POA: Diagnosis not present

## 2022-08-21 DIAGNOSIS — D631 Anemia in chronic kidney disease: Secondary | ICD-10-CM | POA: Diagnosis not present

## 2022-08-21 DIAGNOSIS — D509 Iron deficiency anemia, unspecified: Secondary | ICD-10-CM | POA: Diagnosis not present

## 2022-08-21 DIAGNOSIS — N2581 Secondary hyperparathyroidism of renal origin: Secondary | ICD-10-CM | POA: Diagnosis not present

## 2022-08-21 DIAGNOSIS — N186 End stage renal disease: Secondary | ICD-10-CM | POA: Diagnosis not present

## 2022-08-23 DIAGNOSIS — D509 Iron deficiency anemia, unspecified: Secondary | ICD-10-CM | POA: Diagnosis not present

## 2022-08-23 DIAGNOSIS — D631 Anemia in chronic kidney disease: Secondary | ICD-10-CM | POA: Diagnosis not present

## 2022-08-23 DIAGNOSIS — N186 End stage renal disease: Secondary | ICD-10-CM | POA: Diagnosis not present

## 2022-08-23 DIAGNOSIS — N2581 Secondary hyperparathyroidism of renal origin: Secondary | ICD-10-CM | POA: Diagnosis not present

## 2022-08-25 DIAGNOSIS — N186 End stage renal disease: Secondary | ICD-10-CM | POA: Diagnosis not present

## 2022-08-25 DIAGNOSIS — N2581 Secondary hyperparathyroidism of renal origin: Secondary | ICD-10-CM | POA: Diagnosis not present

## 2022-08-25 DIAGNOSIS — D631 Anemia in chronic kidney disease: Secondary | ICD-10-CM | POA: Diagnosis not present

## 2022-08-25 DIAGNOSIS — D509 Iron deficiency anemia, unspecified: Secondary | ICD-10-CM | POA: Diagnosis not present

## 2022-08-28 DIAGNOSIS — D631 Anemia in chronic kidney disease: Secondary | ICD-10-CM | POA: Diagnosis not present

## 2022-08-28 DIAGNOSIS — D509 Iron deficiency anemia, unspecified: Secondary | ICD-10-CM | POA: Diagnosis not present

## 2022-08-28 DIAGNOSIS — N186 End stage renal disease: Secondary | ICD-10-CM | POA: Diagnosis not present

## 2022-08-28 DIAGNOSIS — N2581 Secondary hyperparathyroidism of renal origin: Secondary | ICD-10-CM | POA: Diagnosis not present

## 2022-08-30 DIAGNOSIS — D509 Iron deficiency anemia, unspecified: Secondary | ICD-10-CM | POA: Diagnosis not present

## 2022-08-30 DIAGNOSIS — D631 Anemia in chronic kidney disease: Secondary | ICD-10-CM | POA: Diagnosis not present

## 2022-08-30 DIAGNOSIS — N186 End stage renal disease: Secondary | ICD-10-CM | POA: Diagnosis not present

## 2022-08-30 DIAGNOSIS — N2581 Secondary hyperparathyroidism of renal origin: Secondary | ICD-10-CM | POA: Diagnosis not present

## 2022-09-01 DIAGNOSIS — N186 End stage renal disease: Secondary | ICD-10-CM | POA: Diagnosis not present

## 2022-09-01 DIAGNOSIS — N2581 Secondary hyperparathyroidism of renal origin: Secondary | ICD-10-CM | POA: Diagnosis not present

## 2022-09-01 DIAGNOSIS — D509 Iron deficiency anemia, unspecified: Secondary | ICD-10-CM | POA: Diagnosis not present

## 2022-09-01 DIAGNOSIS — D631 Anemia in chronic kidney disease: Secondary | ICD-10-CM | POA: Diagnosis not present

## 2022-09-03 DIAGNOSIS — N186 End stage renal disease: Secondary | ICD-10-CM | POA: Diagnosis not present

## 2022-09-03 DIAGNOSIS — Z992 Dependence on renal dialysis: Secondary | ICD-10-CM | POA: Diagnosis not present

## 2022-09-04 DIAGNOSIS — N186 End stage renal disease: Secondary | ICD-10-CM | POA: Diagnosis not present

## 2022-09-04 DIAGNOSIS — N2581 Secondary hyperparathyroidism of renal origin: Secondary | ICD-10-CM | POA: Diagnosis not present

## 2022-09-04 DIAGNOSIS — D509 Iron deficiency anemia, unspecified: Secondary | ICD-10-CM | POA: Diagnosis not present

## 2022-09-04 DIAGNOSIS — D631 Anemia in chronic kidney disease: Secondary | ICD-10-CM | POA: Diagnosis not present

## 2022-09-06 DIAGNOSIS — D509 Iron deficiency anemia, unspecified: Secondary | ICD-10-CM | POA: Diagnosis not present

## 2022-09-06 DIAGNOSIS — N2581 Secondary hyperparathyroidism of renal origin: Secondary | ICD-10-CM | POA: Diagnosis not present

## 2022-09-06 DIAGNOSIS — N186 End stage renal disease: Secondary | ICD-10-CM | POA: Diagnosis not present

## 2022-09-06 DIAGNOSIS — D631 Anemia in chronic kidney disease: Secondary | ICD-10-CM | POA: Diagnosis not present

## 2022-09-08 DIAGNOSIS — D509 Iron deficiency anemia, unspecified: Secondary | ICD-10-CM | POA: Diagnosis not present

## 2022-09-08 DIAGNOSIS — D631 Anemia in chronic kidney disease: Secondary | ICD-10-CM | POA: Diagnosis not present

## 2022-09-08 DIAGNOSIS — N2581 Secondary hyperparathyroidism of renal origin: Secondary | ICD-10-CM | POA: Diagnosis not present

## 2022-09-08 DIAGNOSIS — N186 End stage renal disease: Secondary | ICD-10-CM | POA: Diagnosis not present

## 2022-09-11 DIAGNOSIS — D509 Iron deficiency anemia, unspecified: Secondary | ICD-10-CM | POA: Diagnosis not present

## 2022-09-11 DIAGNOSIS — N2581 Secondary hyperparathyroidism of renal origin: Secondary | ICD-10-CM | POA: Diagnosis not present

## 2022-09-11 DIAGNOSIS — N186 End stage renal disease: Secondary | ICD-10-CM | POA: Diagnosis not present

## 2022-09-11 DIAGNOSIS — D631 Anemia in chronic kidney disease: Secondary | ICD-10-CM | POA: Diagnosis not present

## 2022-09-13 DIAGNOSIS — D509 Iron deficiency anemia, unspecified: Secondary | ICD-10-CM | POA: Diagnosis not present

## 2022-09-13 DIAGNOSIS — N39498 Other specified urinary incontinence: Secondary | ICD-10-CM | POA: Diagnosis not present

## 2022-09-13 DIAGNOSIS — E1122 Type 2 diabetes mellitus with diabetic chronic kidney disease: Secondary | ICD-10-CM | POA: Diagnosis not present

## 2022-09-13 DIAGNOSIS — N186 End stage renal disease: Secondary | ICD-10-CM | POA: Diagnosis not present

## 2022-09-13 DIAGNOSIS — D631 Anemia in chronic kidney disease: Secondary | ICD-10-CM | POA: Diagnosis not present

## 2022-09-13 DIAGNOSIS — E119 Type 2 diabetes mellitus without complications: Secondary | ICD-10-CM | POA: Diagnosis not present

## 2022-09-13 DIAGNOSIS — N2581 Secondary hyperparathyroidism of renal origin: Secondary | ICD-10-CM | POA: Diagnosis not present

## 2022-09-15 DIAGNOSIS — D509 Iron deficiency anemia, unspecified: Secondary | ICD-10-CM | POA: Diagnosis not present

## 2022-09-15 DIAGNOSIS — N2581 Secondary hyperparathyroidism of renal origin: Secondary | ICD-10-CM | POA: Diagnosis not present

## 2022-09-15 DIAGNOSIS — N186 End stage renal disease: Secondary | ICD-10-CM | POA: Diagnosis not present

## 2022-09-15 DIAGNOSIS — D631 Anemia in chronic kidney disease: Secondary | ICD-10-CM | POA: Diagnosis not present

## 2022-09-18 DIAGNOSIS — D509 Iron deficiency anemia, unspecified: Secondary | ICD-10-CM | POA: Diagnosis not present

## 2022-09-18 DIAGNOSIS — D631 Anemia in chronic kidney disease: Secondary | ICD-10-CM | POA: Diagnosis not present

## 2022-09-18 DIAGNOSIS — N186 End stage renal disease: Secondary | ICD-10-CM | POA: Diagnosis not present

## 2022-09-18 DIAGNOSIS — N2581 Secondary hyperparathyroidism of renal origin: Secondary | ICD-10-CM | POA: Diagnosis not present

## 2022-09-20 DIAGNOSIS — N2581 Secondary hyperparathyroidism of renal origin: Secondary | ICD-10-CM | POA: Diagnosis not present

## 2022-09-20 DIAGNOSIS — N186 End stage renal disease: Secondary | ICD-10-CM | POA: Diagnosis not present

## 2022-09-20 DIAGNOSIS — D631 Anemia in chronic kidney disease: Secondary | ICD-10-CM | POA: Diagnosis not present

## 2022-09-20 DIAGNOSIS — D509 Iron deficiency anemia, unspecified: Secondary | ICD-10-CM | POA: Diagnosis not present

## 2022-09-22 DIAGNOSIS — N2581 Secondary hyperparathyroidism of renal origin: Secondary | ICD-10-CM | POA: Diagnosis not present

## 2022-09-22 DIAGNOSIS — D631 Anemia in chronic kidney disease: Secondary | ICD-10-CM | POA: Diagnosis not present

## 2022-09-22 DIAGNOSIS — D509 Iron deficiency anemia, unspecified: Secondary | ICD-10-CM | POA: Diagnosis not present

## 2022-09-22 DIAGNOSIS — N186 End stage renal disease: Secondary | ICD-10-CM | POA: Diagnosis not present

## 2022-09-25 DIAGNOSIS — D509 Iron deficiency anemia, unspecified: Secondary | ICD-10-CM | POA: Diagnosis not present

## 2022-09-25 DIAGNOSIS — N186 End stage renal disease: Secondary | ICD-10-CM | POA: Diagnosis not present

## 2022-09-25 DIAGNOSIS — D631 Anemia in chronic kidney disease: Secondary | ICD-10-CM | POA: Diagnosis not present

## 2022-09-25 DIAGNOSIS — N2581 Secondary hyperparathyroidism of renal origin: Secondary | ICD-10-CM | POA: Diagnosis not present

## 2022-09-27 DIAGNOSIS — N2581 Secondary hyperparathyroidism of renal origin: Secondary | ICD-10-CM | POA: Diagnosis not present

## 2022-09-27 DIAGNOSIS — D509 Iron deficiency anemia, unspecified: Secondary | ICD-10-CM | POA: Diagnosis not present

## 2022-09-27 DIAGNOSIS — D631 Anemia in chronic kidney disease: Secondary | ICD-10-CM | POA: Diagnosis not present

## 2022-09-27 DIAGNOSIS — N186 End stage renal disease: Secondary | ICD-10-CM | POA: Diagnosis not present

## 2022-09-29 DIAGNOSIS — D509 Iron deficiency anemia, unspecified: Secondary | ICD-10-CM | POA: Diagnosis not present

## 2022-09-29 DIAGNOSIS — N2581 Secondary hyperparathyroidism of renal origin: Secondary | ICD-10-CM | POA: Diagnosis not present

## 2022-09-29 DIAGNOSIS — D631 Anemia in chronic kidney disease: Secondary | ICD-10-CM | POA: Diagnosis not present

## 2022-09-29 DIAGNOSIS — N186 End stage renal disease: Secondary | ICD-10-CM | POA: Diagnosis not present

## 2022-10-02 DIAGNOSIS — D631 Anemia in chronic kidney disease: Secondary | ICD-10-CM | POA: Diagnosis not present

## 2022-10-02 DIAGNOSIS — N2581 Secondary hyperparathyroidism of renal origin: Secondary | ICD-10-CM | POA: Diagnosis not present

## 2022-10-02 DIAGNOSIS — N186 End stage renal disease: Secondary | ICD-10-CM | POA: Diagnosis not present

## 2022-10-02 DIAGNOSIS — D509 Iron deficiency anemia, unspecified: Secondary | ICD-10-CM | POA: Diagnosis not present

## 2022-10-04 DIAGNOSIS — N186 End stage renal disease: Secondary | ICD-10-CM | POA: Diagnosis not present

## 2022-10-04 DIAGNOSIS — D631 Anemia in chronic kidney disease: Secondary | ICD-10-CM | POA: Diagnosis not present

## 2022-10-04 DIAGNOSIS — Z992 Dependence on renal dialysis: Secondary | ICD-10-CM | POA: Diagnosis not present

## 2022-10-04 DIAGNOSIS — N2581 Secondary hyperparathyroidism of renal origin: Secondary | ICD-10-CM | POA: Diagnosis not present

## 2022-10-04 DIAGNOSIS — D509 Iron deficiency anemia, unspecified: Secondary | ICD-10-CM | POA: Diagnosis not present

## 2022-10-06 DIAGNOSIS — D631 Anemia in chronic kidney disease: Secondary | ICD-10-CM | POA: Diagnosis not present

## 2022-10-06 DIAGNOSIS — N2581 Secondary hyperparathyroidism of renal origin: Secondary | ICD-10-CM | POA: Diagnosis not present

## 2022-10-06 DIAGNOSIS — D509 Iron deficiency anemia, unspecified: Secondary | ICD-10-CM | POA: Diagnosis not present

## 2022-10-06 DIAGNOSIS — N186 End stage renal disease: Secondary | ICD-10-CM | POA: Diagnosis not present

## 2022-10-09 DIAGNOSIS — D509 Iron deficiency anemia, unspecified: Secondary | ICD-10-CM | POA: Diagnosis not present

## 2022-10-09 DIAGNOSIS — D631 Anemia in chronic kidney disease: Secondary | ICD-10-CM | POA: Diagnosis not present

## 2022-10-09 DIAGNOSIS — N186 End stage renal disease: Secondary | ICD-10-CM | POA: Diagnosis not present

## 2022-10-09 DIAGNOSIS — N2581 Secondary hyperparathyroidism of renal origin: Secondary | ICD-10-CM | POA: Diagnosis not present

## 2022-10-11 DIAGNOSIS — D509 Iron deficiency anemia, unspecified: Secondary | ICD-10-CM | POA: Diagnosis not present

## 2022-10-11 DIAGNOSIS — N186 End stage renal disease: Secondary | ICD-10-CM | POA: Diagnosis not present

## 2022-10-11 DIAGNOSIS — N2581 Secondary hyperparathyroidism of renal origin: Secondary | ICD-10-CM | POA: Diagnosis not present

## 2022-10-11 DIAGNOSIS — D631 Anemia in chronic kidney disease: Secondary | ICD-10-CM | POA: Diagnosis not present

## 2022-10-11 DIAGNOSIS — E1122 Type 2 diabetes mellitus with diabetic chronic kidney disease: Secondary | ICD-10-CM | POA: Diagnosis not present

## 2022-10-13 DIAGNOSIS — N186 End stage renal disease: Secondary | ICD-10-CM | POA: Diagnosis not present

## 2022-10-13 DIAGNOSIS — D631 Anemia in chronic kidney disease: Secondary | ICD-10-CM | POA: Diagnosis not present

## 2022-10-13 DIAGNOSIS — N2581 Secondary hyperparathyroidism of renal origin: Secondary | ICD-10-CM | POA: Diagnosis not present

## 2022-10-13 DIAGNOSIS — D509 Iron deficiency anemia, unspecified: Secondary | ICD-10-CM | POA: Diagnosis not present

## 2022-10-13 DIAGNOSIS — E119 Type 2 diabetes mellitus without complications: Secondary | ICD-10-CM | POA: Diagnosis not present

## 2022-10-13 DIAGNOSIS — N39498 Other specified urinary incontinence: Secondary | ICD-10-CM | POA: Diagnosis not present

## 2022-10-16 DIAGNOSIS — N2581 Secondary hyperparathyroidism of renal origin: Secondary | ICD-10-CM | POA: Diagnosis not present

## 2022-10-16 DIAGNOSIS — N186 End stage renal disease: Secondary | ICD-10-CM | POA: Diagnosis not present

## 2022-10-16 DIAGNOSIS — D631 Anemia in chronic kidney disease: Secondary | ICD-10-CM | POA: Diagnosis not present

## 2022-10-16 DIAGNOSIS — D509 Iron deficiency anemia, unspecified: Secondary | ICD-10-CM | POA: Diagnosis not present

## 2022-10-18 DIAGNOSIS — N186 End stage renal disease: Secondary | ICD-10-CM | POA: Diagnosis not present

## 2022-10-18 DIAGNOSIS — D631 Anemia in chronic kidney disease: Secondary | ICD-10-CM | POA: Diagnosis not present

## 2022-10-18 DIAGNOSIS — D509 Iron deficiency anemia, unspecified: Secondary | ICD-10-CM | POA: Diagnosis not present

## 2022-10-18 DIAGNOSIS — N2581 Secondary hyperparathyroidism of renal origin: Secondary | ICD-10-CM | POA: Diagnosis not present

## 2022-10-20 DIAGNOSIS — N186 End stage renal disease: Secondary | ICD-10-CM | POA: Diagnosis not present

## 2022-10-20 DIAGNOSIS — D631 Anemia in chronic kidney disease: Secondary | ICD-10-CM | POA: Diagnosis not present

## 2022-10-20 DIAGNOSIS — N2581 Secondary hyperparathyroidism of renal origin: Secondary | ICD-10-CM | POA: Diagnosis not present

## 2022-10-20 DIAGNOSIS — D509 Iron deficiency anemia, unspecified: Secondary | ICD-10-CM | POA: Diagnosis not present

## 2022-10-23 DIAGNOSIS — N186 End stage renal disease: Secondary | ICD-10-CM | POA: Diagnosis not present

## 2022-10-23 DIAGNOSIS — D631 Anemia in chronic kidney disease: Secondary | ICD-10-CM | POA: Diagnosis not present

## 2022-10-23 DIAGNOSIS — N2581 Secondary hyperparathyroidism of renal origin: Secondary | ICD-10-CM | POA: Diagnosis not present

## 2022-10-23 DIAGNOSIS — D509 Iron deficiency anemia, unspecified: Secondary | ICD-10-CM | POA: Diagnosis not present

## 2022-10-25 DIAGNOSIS — D631 Anemia in chronic kidney disease: Secondary | ICD-10-CM | POA: Diagnosis not present

## 2022-10-25 DIAGNOSIS — D509 Iron deficiency anemia, unspecified: Secondary | ICD-10-CM | POA: Diagnosis not present

## 2022-10-25 DIAGNOSIS — N2581 Secondary hyperparathyroidism of renal origin: Secondary | ICD-10-CM | POA: Diagnosis not present

## 2022-10-25 DIAGNOSIS — N186 End stage renal disease: Secondary | ICD-10-CM | POA: Diagnosis not present

## 2022-10-27 DIAGNOSIS — I959 Hypotension, unspecified: Secondary | ICD-10-CM | POA: Diagnosis not present

## 2022-10-27 DIAGNOSIS — D509 Iron deficiency anemia, unspecified: Secondary | ICD-10-CM | POA: Diagnosis not present

## 2022-10-27 DIAGNOSIS — R4702 Dysphasia: Secondary | ICD-10-CM | POA: Diagnosis not present

## 2022-10-27 DIAGNOSIS — R58 Hemorrhage, not elsewhere classified: Secondary | ICD-10-CM | POA: Diagnosis not present

## 2022-10-27 DIAGNOSIS — T82898A Other specified complication of vascular prosthetic devices, implants and grafts, initial encounter: Secondary | ICD-10-CM | POA: Diagnosis not present

## 2022-10-27 DIAGNOSIS — T82590A Other mechanical complication of surgically created arteriovenous fistula, initial encounter: Secondary | ICD-10-CM | POA: Diagnosis not present

## 2022-10-27 DIAGNOSIS — N2581 Secondary hyperparathyroidism of renal origin: Secondary | ICD-10-CM | POA: Diagnosis not present

## 2022-10-27 DIAGNOSIS — N186 End stage renal disease: Secondary | ICD-10-CM | POA: Diagnosis not present

## 2022-10-27 DIAGNOSIS — D631 Anemia in chronic kidney disease: Secondary | ICD-10-CM | POA: Diagnosis not present

## 2022-10-30 DIAGNOSIS — N2581 Secondary hyperparathyroidism of renal origin: Secondary | ICD-10-CM | POA: Diagnosis not present

## 2022-10-30 DIAGNOSIS — D631 Anemia in chronic kidney disease: Secondary | ICD-10-CM | POA: Diagnosis not present

## 2022-10-30 DIAGNOSIS — D509 Iron deficiency anemia, unspecified: Secondary | ICD-10-CM | POA: Diagnosis not present

## 2022-10-30 DIAGNOSIS — N186 End stage renal disease: Secondary | ICD-10-CM | POA: Diagnosis not present

## 2022-11-01 DIAGNOSIS — D509 Iron deficiency anemia, unspecified: Secondary | ICD-10-CM | POA: Diagnosis not present

## 2022-11-01 DIAGNOSIS — N186 End stage renal disease: Secondary | ICD-10-CM | POA: Diagnosis not present

## 2022-11-01 DIAGNOSIS — N2581 Secondary hyperparathyroidism of renal origin: Secondary | ICD-10-CM | POA: Diagnosis not present

## 2022-11-01 DIAGNOSIS — D631 Anemia in chronic kidney disease: Secondary | ICD-10-CM | POA: Diagnosis not present

## 2022-11-03 DIAGNOSIS — N2581 Secondary hyperparathyroidism of renal origin: Secondary | ICD-10-CM | POA: Diagnosis not present

## 2022-11-03 DIAGNOSIS — D509 Iron deficiency anemia, unspecified: Secondary | ICD-10-CM | POA: Diagnosis not present

## 2022-11-03 DIAGNOSIS — D631 Anemia in chronic kidney disease: Secondary | ICD-10-CM | POA: Diagnosis not present

## 2022-11-03 DIAGNOSIS — N186 End stage renal disease: Secondary | ICD-10-CM | POA: Diagnosis not present

## 2022-11-04 DIAGNOSIS — N186 End stage renal disease: Secondary | ICD-10-CM | POA: Diagnosis not present

## 2022-11-04 DIAGNOSIS — Z992 Dependence on renal dialysis: Secondary | ICD-10-CM | POA: Diagnosis not present

## 2022-11-06 DIAGNOSIS — Z23 Encounter for immunization: Secondary | ICD-10-CM | POA: Diagnosis not present

## 2022-11-06 DIAGNOSIS — D509 Iron deficiency anemia, unspecified: Secondary | ICD-10-CM | POA: Diagnosis not present

## 2022-11-06 DIAGNOSIS — N186 End stage renal disease: Secondary | ICD-10-CM | POA: Diagnosis not present

## 2022-11-06 DIAGNOSIS — N2581 Secondary hyperparathyroidism of renal origin: Secondary | ICD-10-CM | POA: Diagnosis not present

## 2022-11-06 DIAGNOSIS — D631 Anemia in chronic kidney disease: Secondary | ICD-10-CM | POA: Diagnosis not present

## 2022-11-08 DIAGNOSIS — E1122 Type 2 diabetes mellitus with diabetic chronic kidney disease: Secondary | ICD-10-CM | POA: Diagnosis not present

## 2022-11-08 DIAGNOSIS — N2581 Secondary hyperparathyroidism of renal origin: Secondary | ICD-10-CM | POA: Diagnosis not present

## 2022-11-08 DIAGNOSIS — Z23 Encounter for immunization: Secondary | ICD-10-CM | POA: Diagnosis not present

## 2022-11-08 DIAGNOSIS — D631 Anemia in chronic kidney disease: Secondary | ICD-10-CM | POA: Diagnosis not present

## 2022-11-08 DIAGNOSIS — N186 End stage renal disease: Secondary | ICD-10-CM | POA: Diagnosis not present

## 2022-11-08 DIAGNOSIS — D509 Iron deficiency anemia, unspecified: Secondary | ICD-10-CM | POA: Diagnosis not present

## 2022-11-10 DIAGNOSIS — N2581 Secondary hyperparathyroidism of renal origin: Secondary | ICD-10-CM | POA: Diagnosis not present

## 2022-11-10 DIAGNOSIS — D509 Iron deficiency anemia, unspecified: Secondary | ICD-10-CM | POA: Diagnosis not present

## 2022-11-10 DIAGNOSIS — N186 End stage renal disease: Secondary | ICD-10-CM | POA: Diagnosis not present

## 2022-11-10 DIAGNOSIS — D631 Anemia in chronic kidney disease: Secondary | ICD-10-CM | POA: Diagnosis not present

## 2022-11-10 DIAGNOSIS — Z23 Encounter for immunization: Secondary | ICD-10-CM | POA: Diagnosis not present

## 2022-11-13 DIAGNOSIS — D631 Anemia in chronic kidney disease: Secondary | ICD-10-CM | POA: Diagnosis not present

## 2022-11-13 DIAGNOSIS — E119 Type 2 diabetes mellitus without complications: Secondary | ICD-10-CM | POA: Diagnosis not present

## 2022-11-13 DIAGNOSIS — D509 Iron deficiency anemia, unspecified: Secondary | ICD-10-CM | POA: Diagnosis not present

## 2022-11-13 DIAGNOSIS — N2581 Secondary hyperparathyroidism of renal origin: Secondary | ICD-10-CM | POA: Diagnosis not present

## 2022-11-13 DIAGNOSIS — N186 End stage renal disease: Secondary | ICD-10-CM | POA: Diagnosis not present

## 2022-11-13 DIAGNOSIS — N39498 Other specified urinary incontinence: Secondary | ICD-10-CM | POA: Diagnosis not present

## 2022-11-13 DIAGNOSIS — Z23 Encounter for immunization: Secondary | ICD-10-CM | POA: Diagnosis not present

## 2022-11-15 DIAGNOSIS — D631 Anemia in chronic kidney disease: Secondary | ICD-10-CM | POA: Diagnosis not present

## 2022-11-15 DIAGNOSIS — D509 Iron deficiency anemia, unspecified: Secondary | ICD-10-CM | POA: Diagnosis not present

## 2022-11-15 DIAGNOSIS — N2581 Secondary hyperparathyroidism of renal origin: Secondary | ICD-10-CM | POA: Diagnosis not present

## 2022-11-15 DIAGNOSIS — N186 End stage renal disease: Secondary | ICD-10-CM | POA: Diagnosis not present

## 2022-11-15 DIAGNOSIS — Z23 Encounter for immunization: Secondary | ICD-10-CM | POA: Diagnosis not present

## 2022-11-17 DIAGNOSIS — D631 Anemia in chronic kidney disease: Secondary | ICD-10-CM | POA: Diagnosis not present

## 2022-11-17 DIAGNOSIS — N2581 Secondary hyperparathyroidism of renal origin: Secondary | ICD-10-CM | POA: Diagnosis not present

## 2022-11-17 DIAGNOSIS — Z23 Encounter for immunization: Secondary | ICD-10-CM | POA: Diagnosis not present

## 2022-11-17 DIAGNOSIS — N186 End stage renal disease: Secondary | ICD-10-CM | POA: Diagnosis not present

## 2022-11-17 DIAGNOSIS — D509 Iron deficiency anemia, unspecified: Secondary | ICD-10-CM | POA: Diagnosis not present

## 2022-11-20 DIAGNOSIS — N2581 Secondary hyperparathyroidism of renal origin: Secondary | ICD-10-CM | POA: Diagnosis not present

## 2022-11-20 DIAGNOSIS — D509 Iron deficiency anemia, unspecified: Secondary | ICD-10-CM | POA: Diagnosis not present

## 2022-11-20 DIAGNOSIS — N186 End stage renal disease: Secondary | ICD-10-CM | POA: Diagnosis not present

## 2022-11-20 DIAGNOSIS — D631 Anemia in chronic kidney disease: Secondary | ICD-10-CM | POA: Diagnosis not present

## 2022-11-20 DIAGNOSIS — Z23 Encounter for immunization: Secondary | ICD-10-CM | POA: Diagnosis not present

## 2022-11-22 DIAGNOSIS — D509 Iron deficiency anemia, unspecified: Secondary | ICD-10-CM | POA: Diagnosis not present

## 2022-11-22 DIAGNOSIS — N2581 Secondary hyperparathyroidism of renal origin: Secondary | ICD-10-CM | POA: Diagnosis not present

## 2022-11-22 DIAGNOSIS — Z23 Encounter for immunization: Secondary | ICD-10-CM | POA: Diagnosis not present

## 2022-11-22 DIAGNOSIS — D631 Anemia in chronic kidney disease: Secondary | ICD-10-CM | POA: Diagnosis not present

## 2022-11-22 DIAGNOSIS — N186 End stage renal disease: Secondary | ICD-10-CM | POA: Diagnosis not present

## 2022-11-23 DIAGNOSIS — E559 Vitamin D deficiency, unspecified: Secondary | ICD-10-CM | POA: Diagnosis not present

## 2022-11-23 DIAGNOSIS — E1122 Type 2 diabetes mellitus with diabetic chronic kidney disease: Secondary | ICD-10-CM | POA: Diagnosis not present

## 2022-11-23 DIAGNOSIS — N186 End stage renal disease: Secondary | ICD-10-CM | POA: Diagnosis not present

## 2022-11-23 DIAGNOSIS — Z0001 Encounter for general adult medical examination with abnormal findings: Secondary | ICD-10-CM | POA: Diagnosis not present

## 2022-11-23 DIAGNOSIS — E119 Type 2 diabetes mellitus without complications: Secondary | ICD-10-CM | POA: Diagnosis not present

## 2022-11-23 DIAGNOSIS — D508 Other iron deficiency anemias: Secondary | ICD-10-CM | POA: Diagnosis not present

## 2022-11-23 DIAGNOSIS — J301 Allergic rhinitis due to pollen: Secondary | ICD-10-CM | POA: Diagnosis not present

## 2022-11-23 DIAGNOSIS — E7800 Pure hypercholesterolemia, unspecified: Secondary | ICD-10-CM | POA: Diagnosis not present

## 2022-11-23 DIAGNOSIS — Z992 Dependence on renal dialysis: Secondary | ICD-10-CM | POA: Diagnosis not present

## 2022-11-23 DIAGNOSIS — I10 Essential (primary) hypertension: Secondary | ICD-10-CM | POA: Diagnosis not present

## 2022-11-23 DIAGNOSIS — Z125 Encounter for screening for malignant neoplasm of prostate: Secondary | ICD-10-CM | POA: Diagnosis not present

## 2022-11-23 DIAGNOSIS — F329 Major depressive disorder, single episode, unspecified: Secondary | ICD-10-CM | POA: Diagnosis not present

## 2022-11-24 DIAGNOSIS — N186 End stage renal disease: Secondary | ICD-10-CM | POA: Diagnosis not present

## 2022-11-24 DIAGNOSIS — D509 Iron deficiency anemia, unspecified: Secondary | ICD-10-CM | POA: Diagnosis not present

## 2022-11-24 DIAGNOSIS — Z23 Encounter for immunization: Secondary | ICD-10-CM | POA: Diagnosis not present

## 2022-11-24 DIAGNOSIS — D631 Anemia in chronic kidney disease: Secondary | ICD-10-CM | POA: Diagnosis not present

## 2022-11-24 DIAGNOSIS — N2581 Secondary hyperparathyroidism of renal origin: Secondary | ICD-10-CM | POA: Diagnosis not present

## 2022-11-27 DIAGNOSIS — D509 Iron deficiency anemia, unspecified: Secondary | ICD-10-CM | POA: Diagnosis not present

## 2022-11-27 DIAGNOSIS — N2581 Secondary hyperparathyroidism of renal origin: Secondary | ICD-10-CM | POA: Diagnosis not present

## 2022-11-27 DIAGNOSIS — N186 End stage renal disease: Secondary | ICD-10-CM | POA: Diagnosis not present

## 2022-11-27 DIAGNOSIS — Z23 Encounter for immunization: Secondary | ICD-10-CM | POA: Diagnosis not present

## 2022-11-27 DIAGNOSIS — D631 Anemia in chronic kidney disease: Secondary | ICD-10-CM | POA: Diagnosis not present

## 2022-11-29 DIAGNOSIS — N186 End stage renal disease: Secondary | ICD-10-CM | POA: Diagnosis not present

## 2022-11-29 DIAGNOSIS — D631 Anemia in chronic kidney disease: Secondary | ICD-10-CM | POA: Diagnosis not present

## 2022-11-29 DIAGNOSIS — D509 Iron deficiency anemia, unspecified: Secondary | ICD-10-CM | POA: Diagnosis not present

## 2022-11-29 DIAGNOSIS — Z23 Encounter for immunization: Secondary | ICD-10-CM | POA: Diagnosis not present

## 2022-11-29 DIAGNOSIS — N2581 Secondary hyperparathyroidism of renal origin: Secondary | ICD-10-CM | POA: Diagnosis not present

## 2022-12-01 DIAGNOSIS — N186 End stage renal disease: Secondary | ICD-10-CM | POA: Diagnosis not present

## 2022-12-01 DIAGNOSIS — D509 Iron deficiency anemia, unspecified: Secondary | ICD-10-CM | POA: Diagnosis not present

## 2022-12-01 DIAGNOSIS — N2581 Secondary hyperparathyroidism of renal origin: Secondary | ICD-10-CM | POA: Diagnosis not present

## 2022-12-01 DIAGNOSIS — D631 Anemia in chronic kidney disease: Secondary | ICD-10-CM | POA: Diagnosis not present

## 2022-12-01 DIAGNOSIS — Z23 Encounter for immunization: Secondary | ICD-10-CM | POA: Diagnosis not present

## 2022-12-04 DIAGNOSIS — Z23 Encounter for immunization: Secondary | ICD-10-CM | POA: Diagnosis not present

## 2022-12-04 DIAGNOSIS — D631 Anemia in chronic kidney disease: Secondary | ICD-10-CM | POA: Diagnosis not present

## 2022-12-04 DIAGNOSIS — Z992 Dependence on renal dialysis: Secondary | ICD-10-CM | POA: Diagnosis not present

## 2022-12-04 DIAGNOSIS — D509 Iron deficiency anemia, unspecified: Secondary | ICD-10-CM | POA: Diagnosis not present

## 2022-12-04 DIAGNOSIS — N186 End stage renal disease: Secondary | ICD-10-CM | POA: Diagnosis not present

## 2022-12-04 DIAGNOSIS — N2581 Secondary hyperparathyroidism of renal origin: Secondary | ICD-10-CM | POA: Diagnosis not present

## 2022-12-06 DIAGNOSIS — N186 End stage renal disease: Secondary | ICD-10-CM | POA: Diagnosis not present

## 2022-12-06 DIAGNOSIS — E1122 Type 2 diabetes mellitus with diabetic chronic kidney disease: Secondary | ICD-10-CM | POA: Diagnosis not present

## 2022-12-06 DIAGNOSIS — N2581 Secondary hyperparathyroidism of renal origin: Secondary | ICD-10-CM | POA: Diagnosis not present

## 2022-12-06 DIAGNOSIS — D509 Iron deficiency anemia, unspecified: Secondary | ICD-10-CM | POA: Diagnosis not present

## 2022-12-06 DIAGNOSIS — D631 Anemia in chronic kidney disease: Secondary | ICD-10-CM | POA: Diagnosis not present

## 2022-12-08 DIAGNOSIS — N186 End stage renal disease: Secondary | ICD-10-CM | POA: Diagnosis not present

## 2022-12-08 DIAGNOSIS — N2581 Secondary hyperparathyroidism of renal origin: Secondary | ICD-10-CM | POA: Diagnosis not present

## 2022-12-08 DIAGNOSIS — D509 Iron deficiency anemia, unspecified: Secondary | ICD-10-CM | POA: Diagnosis not present

## 2022-12-08 DIAGNOSIS — D631 Anemia in chronic kidney disease: Secondary | ICD-10-CM | POA: Diagnosis not present

## 2022-12-11 DIAGNOSIS — D509 Iron deficiency anemia, unspecified: Secondary | ICD-10-CM | POA: Diagnosis not present

## 2022-12-11 DIAGNOSIS — D631 Anemia in chronic kidney disease: Secondary | ICD-10-CM | POA: Diagnosis not present

## 2022-12-11 DIAGNOSIS — N2581 Secondary hyperparathyroidism of renal origin: Secondary | ICD-10-CM | POA: Diagnosis not present

## 2022-12-11 DIAGNOSIS — N186 End stage renal disease: Secondary | ICD-10-CM | POA: Diagnosis not present

## 2022-12-13 DIAGNOSIS — D509 Iron deficiency anemia, unspecified: Secondary | ICD-10-CM | POA: Diagnosis not present

## 2022-12-13 DIAGNOSIS — D631 Anemia in chronic kidney disease: Secondary | ICD-10-CM | POA: Diagnosis not present

## 2022-12-13 DIAGNOSIS — N2581 Secondary hyperparathyroidism of renal origin: Secondary | ICD-10-CM | POA: Diagnosis not present

## 2022-12-13 DIAGNOSIS — N186 End stage renal disease: Secondary | ICD-10-CM | POA: Diagnosis not present

## 2022-12-15 DIAGNOSIS — D509 Iron deficiency anemia, unspecified: Secondary | ICD-10-CM | POA: Diagnosis not present

## 2022-12-15 DIAGNOSIS — N2581 Secondary hyperparathyroidism of renal origin: Secondary | ICD-10-CM | POA: Diagnosis not present

## 2022-12-15 DIAGNOSIS — N186 End stage renal disease: Secondary | ICD-10-CM | POA: Diagnosis not present

## 2022-12-15 DIAGNOSIS — D631 Anemia in chronic kidney disease: Secondary | ICD-10-CM | POA: Diagnosis not present

## 2022-12-18 DIAGNOSIS — N186 End stage renal disease: Secondary | ICD-10-CM | POA: Diagnosis not present

## 2022-12-18 DIAGNOSIS — N2581 Secondary hyperparathyroidism of renal origin: Secondary | ICD-10-CM | POA: Diagnosis not present

## 2022-12-18 DIAGNOSIS — D509 Iron deficiency anemia, unspecified: Secondary | ICD-10-CM | POA: Diagnosis not present

## 2022-12-18 DIAGNOSIS — D631 Anemia in chronic kidney disease: Secondary | ICD-10-CM | POA: Diagnosis not present

## 2022-12-20 DIAGNOSIS — D509 Iron deficiency anemia, unspecified: Secondary | ICD-10-CM | POA: Diagnosis not present

## 2022-12-20 DIAGNOSIS — D631 Anemia in chronic kidney disease: Secondary | ICD-10-CM | POA: Diagnosis not present

## 2022-12-20 DIAGNOSIS — N2581 Secondary hyperparathyroidism of renal origin: Secondary | ICD-10-CM | POA: Diagnosis not present

## 2022-12-20 DIAGNOSIS — N186 End stage renal disease: Secondary | ICD-10-CM | POA: Diagnosis not present

## 2022-12-22 DIAGNOSIS — N186 End stage renal disease: Secondary | ICD-10-CM | POA: Diagnosis not present

## 2022-12-22 DIAGNOSIS — D509 Iron deficiency anemia, unspecified: Secondary | ICD-10-CM | POA: Diagnosis not present

## 2022-12-22 DIAGNOSIS — N2581 Secondary hyperparathyroidism of renal origin: Secondary | ICD-10-CM | POA: Diagnosis not present

## 2022-12-22 DIAGNOSIS — D631 Anemia in chronic kidney disease: Secondary | ICD-10-CM | POA: Diagnosis not present

## 2022-12-25 DIAGNOSIS — D631 Anemia in chronic kidney disease: Secondary | ICD-10-CM | POA: Diagnosis not present

## 2022-12-25 DIAGNOSIS — N186 End stage renal disease: Secondary | ICD-10-CM | POA: Diagnosis not present

## 2022-12-25 DIAGNOSIS — D509 Iron deficiency anemia, unspecified: Secondary | ICD-10-CM | POA: Diagnosis not present

## 2022-12-25 DIAGNOSIS — N2581 Secondary hyperparathyroidism of renal origin: Secondary | ICD-10-CM | POA: Diagnosis not present

## 2022-12-27 DIAGNOSIS — N2581 Secondary hyperparathyroidism of renal origin: Secondary | ICD-10-CM | POA: Diagnosis not present

## 2022-12-27 DIAGNOSIS — N186 End stage renal disease: Secondary | ICD-10-CM | POA: Diagnosis not present

## 2022-12-27 DIAGNOSIS — D631 Anemia in chronic kidney disease: Secondary | ICD-10-CM | POA: Diagnosis not present

## 2022-12-27 DIAGNOSIS — D509 Iron deficiency anemia, unspecified: Secondary | ICD-10-CM | POA: Diagnosis not present

## 2022-12-29 DIAGNOSIS — D509 Iron deficiency anemia, unspecified: Secondary | ICD-10-CM | POA: Diagnosis not present

## 2022-12-29 DIAGNOSIS — N2581 Secondary hyperparathyroidism of renal origin: Secondary | ICD-10-CM | POA: Diagnosis not present

## 2022-12-29 DIAGNOSIS — N186 End stage renal disease: Secondary | ICD-10-CM | POA: Diagnosis not present

## 2022-12-29 DIAGNOSIS — D631 Anemia in chronic kidney disease: Secondary | ICD-10-CM | POA: Diagnosis not present

## 2023-01-01 DIAGNOSIS — N186 End stage renal disease: Secondary | ICD-10-CM | POA: Diagnosis not present

## 2023-01-01 DIAGNOSIS — N2581 Secondary hyperparathyroidism of renal origin: Secondary | ICD-10-CM | POA: Diagnosis not present

## 2023-01-01 DIAGNOSIS — D631 Anemia in chronic kidney disease: Secondary | ICD-10-CM | POA: Diagnosis not present

## 2023-01-01 DIAGNOSIS — D509 Iron deficiency anemia, unspecified: Secondary | ICD-10-CM | POA: Diagnosis not present

## 2023-01-03 DIAGNOSIS — N186 End stage renal disease: Secondary | ICD-10-CM | POA: Diagnosis not present

## 2023-01-03 DIAGNOSIS — D631 Anemia in chronic kidney disease: Secondary | ICD-10-CM | POA: Diagnosis not present

## 2023-01-03 DIAGNOSIS — D509 Iron deficiency anemia, unspecified: Secondary | ICD-10-CM | POA: Diagnosis not present

## 2023-01-03 DIAGNOSIS — N2581 Secondary hyperparathyroidism of renal origin: Secondary | ICD-10-CM | POA: Diagnosis not present

## 2023-01-04 DIAGNOSIS — D508 Other iron deficiency anemias: Secondary | ICD-10-CM | POA: Diagnosis not present

## 2023-01-04 DIAGNOSIS — I10 Essential (primary) hypertension: Secondary | ICD-10-CM | POA: Diagnosis not present

## 2023-01-04 DIAGNOSIS — N186 End stage renal disease: Secondary | ICD-10-CM | POA: Diagnosis not present

## 2023-01-04 DIAGNOSIS — F329 Major depressive disorder, single episode, unspecified: Secondary | ICD-10-CM | POA: Diagnosis not present

## 2023-01-04 DIAGNOSIS — E7800 Pure hypercholesterolemia, unspecified: Secondary | ICD-10-CM | POA: Diagnosis not present

## 2023-01-04 DIAGNOSIS — J301 Allergic rhinitis due to pollen: Secondary | ICD-10-CM | POA: Diagnosis not present

## 2023-01-04 DIAGNOSIS — E559 Vitamin D deficiency, unspecified: Secondary | ICD-10-CM | POA: Diagnosis not present

## 2023-01-04 DIAGNOSIS — E1122 Type 2 diabetes mellitus with diabetic chronic kidney disease: Secondary | ICD-10-CM | POA: Diagnosis not present

## 2023-01-04 DIAGNOSIS — Z992 Dependence on renal dialysis: Secondary | ICD-10-CM | POA: Diagnosis not present

## 2023-01-05 DIAGNOSIS — N2581 Secondary hyperparathyroidism of renal origin: Secondary | ICD-10-CM | POA: Diagnosis not present

## 2023-01-05 DIAGNOSIS — D509 Iron deficiency anemia, unspecified: Secondary | ICD-10-CM | POA: Diagnosis not present

## 2023-01-05 DIAGNOSIS — N186 End stage renal disease: Secondary | ICD-10-CM | POA: Diagnosis not present

## 2023-01-05 DIAGNOSIS — D631 Anemia in chronic kidney disease: Secondary | ICD-10-CM | POA: Diagnosis not present

## 2023-01-08 DIAGNOSIS — D509 Iron deficiency anemia, unspecified: Secondary | ICD-10-CM | POA: Diagnosis not present

## 2023-01-08 DIAGNOSIS — N186 End stage renal disease: Secondary | ICD-10-CM | POA: Diagnosis not present

## 2023-01-08 DIAGNOSIS — D631 Anemia in chronic kidney disease: Secondary | ICD-10-CM | POA: Diagnosis not present

## 2023-01-08 DIAGNOSIS — N2581 Secondary hyperparathyroidism of renal origin: Secondary | ICD-10-CM | POA: Diagnosis not present

## 2023-01-10 DIAGNOSIS — D631 Anemia in chronic kidney disease: Secondary | ICD-10-CM | POA: Diagnosis not present

## 2023-01-10 DIAGNOSIS — E1122 Type 2 diabetes mellitus with diabetic chronic kidney disease: Secondary | ICD-10-CM | POA: Diagnosis not present

## 2023-01-10 DIAGNOSIS — D509 Iron deficiency anemia, unspecified: Secondary | ICD-10-CM | POA: Diagnosis not present

## 2023-01-10 DIAGNOSIS — N186 End stage renal disease: Secondary | ICD-10-CM | POA: Diagnosis not present

## 2023-01-10 DIAGNOSIS — N2581 Secondary hyperparathyroidism of renal origin: Secondary | ICD-10-CM | POA: Diagnosis not present

## 2023-01-12 DIAGNOSIS — N2581 Secondary hyperparathyroidism of renal origin: Secondary | ICD-10-CM | POA: Diagnosis not present

## 2023-01-12 DIAGNOSIS — D509 Iron deficiency anemia, unspecified: Secondary | ICD-10-CM | POA: Diagnosis not present

## 2023-01-12 DIAGNOSIS — N186 End stage renal disease: Secondary | ICD-10-CM | POA: Diagnosis not present

## 2023-01-12 DIAGNOSIS — D631 Anemia in chronic kidney disease: Secondary | ICD-10-CM | POA: Diagnosis not present

## 2023-01-15 DIAGNOSIS — D509 Iron deficiency anemia, unspecified: Secondary | ICD-10-CM | POA: Diagnosis not present

## 2023-01-15 DIAGNOSIS — D631 Anemia in chronic kidney disease: Secondary | ICD-10-CM | POA: Diagnosis not present

## 2023-01-15 DIAGNOSIS — N2581 Secondary hyperparathyroidism of renal origin: Secondary | ICD-10-CM | POA: Diagnosis not present

## 2023-01-15 DIAGNOSIS — N186 End stage renal disease: Secondary | ICD-10-CM | POA: Diagnosis not present

## 2023-01-17 DIAGNOSIS — D509 Iron deficiency anemia, unspecified: Secondary | ICD-10-CM | POA: Diagnosis not present

## 2023-01-17 DIAGNOSIS — D631 Anemia in chronic kidney disease: Secondary | ICD-10-CM | POA: Diagnosis not present

## 2023-01-17 DIAGNOSIS — N186 End stage renal disease: Secondary | ICD-10-CM | POA: Diagnosis not present

## 2023-01-17 DIAGNOSIS — N2581 Secondary hyperparathyroidism of renal origin: Secondary | ICD-10-CM | POA: Diagnosis not present

## 2023-01-19 DIAGNOSIS — D509 Iron deficiency anemia, unspecified: Secondary | ICD-10-CM | POA: Diagnosis not present

## 2023-01-19 DIAGNOSIS — D631 Anemia in chronic kidney disease: Secondary | ICD-10-CM | POA: Diagnosis not present

## 2023-01-19 DIAGNOSIS — N186 End stage renal disease: Secondary | ICD-10-CM | POA: Diagnosis not present

## 2023-01-19 DIAGNOSIS — N2581 Secondary hyperparathyroidism of renal origin: Secondary | ICD-10-CM | POA: Diagnosis not present

## 2023-01-22 DIAGNOSIS — N186 End stage renal disease: Secondary | ICD-10-CM | POA: Diagnosis not present

## 2023-01-22 DIAGNOSIS — D509 Iron deficiency anemia, unspecified: Secondary | ICD-10-CM | POA: Diagnosis not present

## 2023-01-22 DIAGNOSIS — D631 Anemia in chronic kidney disease: Secondary | ICD-10-CM | POA: Diagnosis not present

## 2023-01-22 DIAGNOSIS — N2581 Secondary hyperparathyroidism of renal origin: Secondary | ICD-10-CM | POA: Diagnosis not present

## 2023-01-24 DIAGNOSIS — D631 Anemia in chronic kidney disease: Secondary | ICD-10-CM | POA: Diagnosis not present

## 2023-01-24 DIAGNOSIS — N2581 Secondary hyperparathyroidism of renal origin: Secondary | ICD-10-CM | POA: Diagnosis not present

## 2023-01-24 DIAGNOSIS — N186 End stage renal disease: Secondary | ICD-10-CM | POA: Diagnosis not present

## 2023-01-24 DIAGNOSIS — D509 Iron deficiency anemia, unspecified: Secondary | ICD-10-CM | POA: Diagnosis not present

## 2023-01-26 DIAGNOSIS — N2581 Secondary hyperparathyroidism of renal origin: Secondary | ICD-10-CM | POA: Diagnosis not present

## 2023-01-26 DIAGNOSIS — D631 Anemia in chronic kidney disease: Secondary | ICD-10-CM | POA: Diagnosis not present

## 2023-01-26 DIAGNOSIS — D509 Iron deficiency anemia, unspecified: Secondary | ICD-10-CM | POA: Diagnosis not present

## 2023-01-26 DIAGNOSIS — N186 End stage renal disease: Secondary | ICD-10-CM | POA: Diagnosis not present

## 2023-01-28 DIAGNOSIS — D509 Iron deficiency anemia, unspecified: Secondary | ICD-10-CM | POA: Diagnosis not present

## 2023-01-28 DIAGNOSIS — D631 Anemia in chronic kidney disease: Secondary | ICD-10-CM | POA: Diagnosis not present

## 2023-01-28 DIAGNOSIS — N2581 Secondary hyperparathyroidism of renal origin: Secondary | ICD-10-CM | POA: Diagnosis not present

## 2023-01-28 DIAGNOSIS — N186 End stage renal disease: Secondary | ICD-10-CM | POA: Diagnosis not present

## 2023-01-30 DIAGNOSIS — N2581 Secondary hyperparathyroidism of renal origin: Secondary | ICD-10-CM | POA: Diagnosis not present

## 2023-01-30 DIAGNOSIS — N186 End stage renal disease: Secondary | ICD-10-CM | POA: Diagnosis not present

## 2023-01-30 DIAGNOSIS — D509 Iron deficiency anemia, unspecified: Secondary | ICD-10-CM | POA: Diagnosis not present

## 2023-01-30 DIAGNOSIS — D631 Anemia in chronic kidney disease: Secondary | ICD-10-CM | POA: Diagnosis not present

## 2023-02-02 DIAGNOSIS — N2581 Secondary hyperparathyroidism of renal origin: Secondary | ICD-10-CM | POA: Diagnosis not present

## 2023-02-02 DIAGNOSIS — N186 End stage renal disease: Secondary | ICD-10-CM | POA: Diagnosis not present

## 2023-02-02 DIAGNOSIS — D631 Anemia in chronic kidney disease: Secondary | ICD-10-CM | POA: Diagnosis not present

## 2023-02-02 DIAGNOSIS — D509 Iron deficiency anemia, unspecified: Secondary | ICD-10-CM | POA: Diagnosis not present

## 2023-02-03 DIAGNOSIS — Z992 Dependence on renal dialysis: Secondary | ICD-10-CM | POA: Diagnosis not present

## 2023-02-03 DIAGNOSIS — N186 End stage renal disease: Secondary | ICD-10-CM | POA: Diagnosis not present

## 2023-02-05 DIAGNOSIS — D509 Iron deficiency anemia, unspecified: Secondary | ICD-10-CM | POA: Diagnosis not present

## 2023-02-05 DIAGNOSIS — N2581 Secondary hyperparathyroidism of renal origin: Secondary | ICD-10-CM | POA: Diagnosis not present

## 2023-02-05 DIAGNOSIS — D631 Anemia in chronic kidney disease: Secondary | ICD-10-CM | POA: Diagnosis not present

## 2023-02-05 DIAGNOSIS — N186 End stage renal disease: Secondary | ICD-10-CM | POA: Diagnosis not present

## 2023-02-07 DIAGNOSIS — E1122 Type 2 diabetes mellitus with diabetic chronic kidney disease: Secondary | ICD-10-CM | POA: Diagnosis not present

## 2023-02-07 DIAGNOSIS — N186 End stage renal disease: Secondary | ICD-10-CM | POA: Diagnosis not present

## 2023-02-07 DIAGNOSIS — D631 Anemia in chronic kidney disease: Secondary | ICD-10-CM | POA: Diagnosis not present

## 2023-02-07 DIAGNOSIS — N2581 Secondary hyperparathyroidism of renal origin: Secondary | ICD-10-CM | POA: Diagnosis not present

## 2023-02-07 DIAGNOSIS — D509 Iron deficiency anemia, unspecified: Secondary | ICD-10-CM | POA: Diagnosis not present

## 2023-02-09 DIAGNOSIS — N2581 Secondary hyperparathyroidism of renal origin: Secondary | ICD-10-CM | POA: Diagnosis not present

## 2023-02-09 DIAGNOSIS — D509 Iron deficiency anemia, unspecified: Secondary | ICD-10-CM | POA: Diagnosis not present

## 2023-02-09 DIAGNOSIS — N186 End stage renal disease: Secondary | ICD-10-CM | POA: Diagnosis not present

## 2023-02-09 DIAGNOSIS — D631 Anemia in chronic kidney disease: Secondary | ICD-10-CM | POA: Diagnosis not present

## 2023-02-12 DIAGNOSIS — D509 Iron deficiency anemia, unspecified: Secondary | ICD-10-CM | POA: Diagnosis not present

## 2023-02-12 DIAGNOSIS — N2581 Secondary hyperparathyroidism of renal origin: Secondary | ICD-10-CM | POA: Diagnosis not present

## 2023-02-12 DIAGNOSIS — D631 Anemia in chronic kidney disease: Secondary | ICD-10-CM | POA: Diagnosis not present

## 2023-02-12 DIAGNOSIS — N186 End stage renal disease: Secondary | ICD-10-CM | POA: Diagnosis not present

## 2023-02-14 DIAGNOSIS — D509 Iron deficiency anemia, unspecified: Secondary | ICD-10-CM | POA: Diagnosis not present

## 2023-02-14 DIAGNOSIS — N186 End stage renal disease: Secondary | ICD-10-CM | POA: Diagnosis not present

## 2023-02-14 DIAGNOSIS — D631 Anemia in chronic kidney disease: Secondary | ICD-10-CM | POA: Diagnosis not present

## 2023-02-14 DIAGNOSIS — N2581 Secondary hyperparathyroidism of renal origin: Secondary | ICD-10-CM | POA: Diagnosis not present

## 2023-02-16 DIAGNOSIS — N2581 Secondary hyperparathyroidism of renal origin: Secondary | ICD-10-CM | POA: Diagnosis not present

## 2023-02-16 DIAGNOSIS — N186 End stage renal disease: Secondary | ICD-10-CM | POA: Diagnosis not present

## 2023-02-16 DIAGNOSIS — D631 Anemia in chronic kidney disease: Secondary | ICD-10-CM | POA: Diagnosis not present

## 2023-02-16 DIAGNOSIS — D509 Iron deficiency anemia, unspecified: Secondary | ICD-10-CM | POA: Diagnosis not present

## 2023-02-19 DIAGNOSIS — D509 Iron deficiency anemia, unspecified: Secondary | ICD-10-CM | POA: Diagnosis not present

## 2023-02-19 DIAGNOSIS — D631 Anemia in chronic kidney disease: Secondary | ICD-10-CM | POA: Diagnosis not present

## 2023-02-19 DIAGNOSIS — N186 End stage renal disease: Secondary | ICD-10-CM | POA: Diagnosis not present

## 2023-02-19 DIAGNOSIS — N2581 Secondary hyperparathyroidism of renal origin: Secondary | ICD-10-CM | POA: Diagnosis not present

## 2023-02-21 DIAGNOSIS — D631 Anemia in chronic kidney disease: Secondary | ICD-10-CM | POA: Diagnosis not present

## 2023-02-21 DIAGNOSIS — N186 End stage renal disease: Secondary | ICD-10-CM | POA: Diagnosis not present

## 2023-02-21 DIAGNOSIS — D509 Iron deficiency anemia, unspecified: Secondary | ICD-10-CM | POA: Diagnosis not present

## 2023-02-21 DIAGNOSIS — N2581 Secondary hyperparathyroidism of renal origin: Secondary | ICD-10-CM | POA: Diagnosis not present

## 2023-02-23 DIAGNOSIS — N2581 Secondary hyperparathyroidism of renal origin: Secondary | ICD-10-CM | POA: Diagnosis not present

## 2023-02-23 DIAGNOSIS — D631 Anemia in chronic kidney disease: Secondary | ICD-10-CM | POA: Diagnosis not present

## 2023-02-23 DIAGNOSIS — N186 End stage renal disease: Secondary | ICD-10-CM | POA: Diagnosis not present

## 2023-02-23 DIAGNOSIS — D509 Iron deficiency anemia, unspecified: Secondary | ICD-10-CM | POA: Diagnosis not present

## 2023-02-25 DIAGNOSIS — D509 Iron deficiency anemia, unspecified: Secondary | ICD-10-CM | POA: Diagnosis not present

## 2023-02-25 DIAGNOSIS — D631 Anemia in chronic kidney disease: Secondary | ICD-10-CM | POA: Diagnosis not present

## 2023-02-25 DIAGNOSIS — N186 End stage renal disease: Secondary | ICD-10-CM | POA: Diagnosis not present

## 2023-02-25 DIAGNOSIS — N2581 Secondary hyperparathyroidism of renal origin: Secondary | ICD-10-CM | POA: Diagnosis not present

## 2023-02-27 DIAGNOSIS — N2581 Secondary hyperparathyroidism of renal origin: Secondary | ICD-10-CM | POA: Diagnosis not present

## 2023-02-27 DIAGNOSIS — N186 End stage renal disease: Secondary | ICD-10-CM | POA: Diagnosis not present

## 2023-02-27 DIAGNOSIS — D509 Iron deficiency anemia, unspecified: Secondary | ICD-10-CM | POA: Diagnosis not present

## 2023-02-27 DIAGNOSIS — D631 Anemia in chronic kidney disease: Secondary | ICD-10-CM | POA: Diagnosis not present

## 2023-03-02 DIAGNOSIS — N2581 Secondary hyperparathyroidism of renal origin: Secondary | ICD-10-CM | POA: Diagnosis not present

## 2023-03-02 DIAGNOSIS — D509 Iron deficiency anemia, unspecified: Secondary | ICD-10-CM | POA: Diagnosis not present

## 2023-03-02 DIAGNOSIS — N186 End stage renal disease: Secondary | ICD-10-CM | POA: Diagnosis not present

## 2023-03-02 DIAGNOSIS — D631 Anemia in chronic kidney disease: Secondary | ICD-10-CM | POA: Diagnosis not present

## 2023-03-04 DIAGNOSIS — N186 End stage renal disease: Secondary | ICD-10-CM | POA: Diagnosis not present

## 2023-03-04 DIAGNOSIS — D631 Anemia in chronic kidney disease: Secondary | ICD-10-CM | POA: Diagnosis not present

## 2023-03-04 DIAGNOSIS — N2581 Secondary hyperparathyroidism of renal origin: Secondary | ICD-10-CM | POA: Diagnosis not present

## 2023-03-04 DIAGNOSIS — D509 Iron deficiency anemia, unspecified: Secondary | ICD-10-CM | POA: Diagnosis not present

## 2023-03-06 DIAGNOSIS — D631 Anemia in chronic kidney disease: Secondary | ICD-10-CM | POA: Diagnosis not present

## 2023-03-06 DIAGNOSIS — Z992 Dependence on renal dialysis: Secondary | ICD-10-CM | POA: Diagnosis not present

## 2023-03-06 DIAGNOSIS — N2581 Secondary hyperparathyroidism of renal origin: Secondary | ICD-10-CM | POA: Diagnosis not present

## 2023-03-06 DIAGNOSIS — N186 End stage renal disease: Secondary | ICD-10-CM | POA: Diagnosis not present

## 2023-03-06 DIAGNOSIS — D509 Iron deficiency anemia, unspecified: Secondary | ICD-10-CM | POA: Diagnosis not present

## 2023-03-06 DIAGNOSIS — Z114 Encounter for screening for human immunodeficiency virus [HIV]: Secondary | ICD-10-CM | POA: Diagnosis not present

## 2023-03-14 DIAGNOSIS — E1122 Type 2 diabetes mellitus with diabetic chronic kidney disease: Secondary | ICD-10-CM | POA: Diagnosis not present

## 2023-03-27 DIAGNOSIS — M79674 Pain in right toe(s): Secondary | ICD-10-CM | POA: Diagnosis not present

## 2023-03-27 DIAGNOSIS — M79675 Pain in left toe(s): Secondary | ICD-10-CM | POA: Diagnosis not present

## 2023-03-27 DIAGNOSIS — M2041 Other hammer toe(s) (acquired), right foot: Secondary | ICD-10-CM | POA: Diagnosis not present

## 2023-03-27 DIAGNOSIS — M2042 Other hammer toe(s) (acquired), left foot: Secondary | ICD-10-CM | POA: Diagnosis not present

## 2023-03-27 DIAGNOSIS — B351 Tinea unguium: Secondary | ICD-10-CM | POA: Diagnosis not present

## 2023-04-06 DIAGNOSIS — N186 End stage renal disease: Secondary | ICD-10-CM | POA: Diagnosis not present

## 2023-04-06 DIAGNOSIS — Z992 Dependence on renal dialysis: Secondary | ICD-10-CM | POA: Diagnosis not present

## 2023-04-11 DIAGNOSIS — E1122 Type 2 diabetes mellitus with diabetic chronic kidney disease: Secondary | ICD-10-CM | POA: Diagnosis not present

## 2023-04-12 DIAGNOSIS — E559 Vitamin D deficiency, unspecified: Secondary | ICD-10-CM | POA: Diagnosis not present

## 2023-04-12 DIAGNOSIS — N186 End stage renal disease: Secondary | ICD-10-CM | POA: Diagnosis not present

## 2023-04-12 DIAGNOSIS — E1122 Type 2 diabetes mellitus with diabetic chronic kidney disease: Secondary | ICD-10-CM | POA: Diagnosis not present

## 2023-04-12 DIAGNOSIS — E7800 Pure hypercholesterolemia, unspecified: Secondary | ICD-10-CM | POA: Diagnosis not present

## 2023-04-12 DIAGNOSIS — D508 Other iron deficiency anemias: Secondary | ICD-10-CM | POA: Diagnosis not present

## 2023-04-12 DIAGNOSIS — F329 Major depressive disorder, single episode, unspecified: Secondary | ICD-10-CM | POA: Diagnosis not present

## 2023-04-12 DIAGNOSIS — I10 Essential (primary) hypertension: Secondary | ICD-10-CM | POA: Diagnosis not present

## 2023-04-12 DIAGNOSIS — Z992 Dependence on renal dialysis: Secondary | ICD-10-CM | POA: Diagnosis not present

## 2023-04-12 DIAGNOSIS — J301 Allergic rhinitis due to pollen: Secondary | ICD-10-CM | POA: Diagnosis not present

## 2023-05-04 DIAGNOSIS — Z992 Dependence on renal dialysis: Secondary | ICD-10-CM | POA: Diagnosis not present

## 2023-05-04 DIAGNOSIS — N186 End stage renal disease: Secondary | ICD-10-CM | POA: Diagnosis not present

## 2023-05-09 DIAGNOSIS — E1122 Type 2 diabetes mellitus with diabetic chronic kidney disease: Secondary | ICD-10-CM | POA: Diagnosis not present

## 2023-06-04 DIAGNOSIS — Z992 Dependence on renal dialysis: Secondary | ICD-10-CM | POA: Diagnosis not present

## 2023-06-04 DIAGNOSIS — N186 End stage renal disease: Secondary | ICD-10-CM | POA: Diagnosis not present

## 2023-06-06 DIAGNOSIS — E1122 Type 2 diabetes mellitus with diabetic chronic kidney disease: Secondary | ICD-10-CM | POA: Diagnosis not present

## 2023-06-21 DIAGNOSIS — Z992 Dependence on renal dialysis: Secondary | ICD-10-CM | POA: Diagnosis not present

## 2023-06-21 DIAGNOSIS — F329 Major depressive disorder, single episode, unspecified: Secondary | ICD-10-CM | POA: Diagnosis not present

## 2023-06-21 DIAGNOSIS — N186 End stage renal disease: Secondary | ICD-10-CM | POA: Diagnosis not present

## 2023-06-21 DIAGNOSIS — I10 Essential (primary) hypertension: Secondary | ICD-10-CM | POA: Diagnosis not present

## 2023-06-21 DIAGNOSIS — E7800 Pure hypercholesterolemia, unspecified: Secondary | ICD-10-CM | POA: Diagnosis not present

## 2023-06-21 DIAGNOSIS — J301 Allergic rhinitis due to pollen: Secondary | ICD-10-CM | POA: Diagnosis not present

## 2023-06-21 DIAGNOSIS — D508 Other iron deficiency anemias: Secondary | ICD-10-CM | POA: Diagnosis not present

## 2023-06-21 DIAGNOSIS — Z0001 Encounter for general adult medical examination with abnormal findings: Secondary | ICD-10-CM | POA: Diagnosis not present

## 2023-06-21 DIAGNOSIS — E1122 Type 2 diabetes mellitus with diabetic chronic kidney disease: Secondary | ICD-10-CM | POA: Diagnosis not present

## 2023-06-21 DIAGNOSIS — E559 Vitamin D deficiency, unspecified: Secondary | ICD-10-CM | POA: Diagnosis not present

## 2023-06-27 DIAGNOSIS — Z1159 Encounter for screening for other viral diseases: Secondary | ICD-10-CM | POA: Diagnosis not present

## 2023-07-04 DIAGNOSIS — N186 End stage renal disease: Secondary | ICD-10-CM | POA: Diagnosis not present

## 2023-07-04 DIAGNOSIS — Z992 Dependence on renal dialysis: Secondary | ICD-10-CM | POA: Diagnosis not present

## 2023-07-11 DIAGNOSIS — E1122 Type 2 diabetes mellitus with diabetic chronic kidney disease: Secondary | ICD-10-CM | POA: Diagnosis not present

## 2023-08-04 DIAGNOSIS — Z992 Dependence on renal dialysis: Secondary | ICD-10-CM | POA: Diagnosis not present

## 2023-08-04 DIAGNOSIS — N186 End stage renal disease: Secondary | ICD-10-CM | POA: Diagnosis not present

## 2023-08-08 DIAGNOSIS — E1122 Type 2 diabetes mellitus with diabetic chronic kidney disease: Secondary | ICD-10-CM | POA: Diagnosis not present

## 2023-09-03 DIAGNOSIS — Z992 Dependence on renal dialysis: Secondary | ICD-10-CM | POA: Diagnosis not present

## 2023-09-03 DIAGNOSIS — N186 End stage renal disease: Secondary | ICD-10-CM | POA: Diagnosis not present

## 2023-09-05 DIAGNOSIS — E1122 Type 2 diabetes mellitus with diabetic chronic kidney disease: Secondary | ICD-10-CM | POA: Diagnosis not present

## 2023-09-13 DIAGNOSIS — Z992 Dependence on renal dialysis: Secondary | ICD-10-CM | POA: Diagnosis not present

## 2023-09-13 DIAGNOSIS — E1122 Type 2 diabetes mellitus with diabetic chronic kidney disease: Secondary | ICD-10-CM | POA: Diagnosis not present

## 2023-09-13 DIAGNOSIS — F329 Major depressive disorder, single episode, unspecified: Secondary | ICD-10-CM | POA: Diagnosis not present

## 2023-09-13 DIAGNOSIS — E559 Vitamin D deficiency, unspecified: Secondary | ICD-10-CM | POA: Diagnosis not present

## 2023-09-13 DIAGNOSIS — D508 Other iron deficiency anemias: Secondary | ICD-10-CM | POA: Diagnosis not present

## 2023-09-13 DIAGNOSIS — N186 End stage renal disease: Secondary | ICD-10-CM | POA: Diagnosis not present

## 2023-09-13 DIAGNOSIS — I10 Essential (primary) hypertension: Secondary | ICD-10-CM | POA: Diagnosis not present

## 2023-09-13 DIAGNOSIS — E7800 Pure hypercholesterolemia, unspecified: Secondary | ICD-10-CM | POA: Diagnosis not present

## 2023-09-13 DIAGNOSIS — J301 Allergic rhinitis due to pollen: Secondary | ICD-10-CM | POA: Diagnosis not present

## 2023-09-25 DIAGNOSIS — B351 Tinea unguium: Secondary | ICD-10-CM | POA: Diagnosis not present

## 2023-09-25 DIAGNOSIS — M79675 Pain in left toe(s): Secondary | ICD-10-CM | POA: Diagnosis not present

## 2023-09-25 DIAGNOSIS — E1122 Type 2 diabetes mellitus with diabetic chronic kidney disease: Secondary | ICD-10-CM | POA: Diagnosis not present

## 2023-09-25 DIAGNOSIS — N186 End stage renal disease: Secondary | ICD-10-CM | POA: Diagnosis not present

## 2023-09-25 DIAGNOSIS — Z7984 Long term (current) use of oral hypoglycemic drugs: Secondary | ICD-10-CM | POA: Diagnosis not present

## 2023-09-25 DIAGNOSIS — M79674 Pain in right toe(s): Secondary | ICD-10-CM | POA: Diagnosis not present

## 2023-09-25 DIAGNOSIS — R0989 Other specified symptoms and signs involving the circulatory and respiratory systems: Secondary | ICD-10-CM | POA: Diagnosis not present

## 2023-10-04 DIAGNOSIS — Z992 Dependence on renal dialysis: Secondary | ICD-10-CM | POA: Diagnosis not present

## 2023-10-04 DIAGNOSIS — N186 End stage renal disease: Secondary | ICD-10-CM | POA: Diagnosis not present

## 2023-10-10 DIAGNOSIS — E1122 Type 2 diabetes mellitus with diabetic chronic kidney disease: Secondary | ICD-10-CM | POA: Diagnosis not present

## 2023-11-04 DIAGNOSIS — N186 End stage renal disease: Secondary | ICD-10-CM | POA: Diagnosis not present

## 2023-11-04 DIAGNOSIS — Z992 Dependence on renal dialysis: Secondary | ICD-10-CM | POA: Diagnosis not present

## 2023-11-05 DIAGNOSIS — N2581 Secondary hyperparathyroidism of renal origin: Secondary | ICD-10-CM | POA: Diagnosis not present

## 2023-11-05 DIAGNOSIS — Z23 Encounter for immunization: Secondary | ICD-10-CM | POA: Diagnosis not present

## 2023-11-05 DIAGNOSIS — Z992 Dependence on renal dialysis: Secondary | ICD-10-CM | POA: Diagnosis not present

## 2023-11-05 DIAGNOSIS — N186 End stage renal disease: Secondary | ICD-10-CM | POA: Diagnosis not present

## 2023-11-05 DIAGNOSIS — D631 Anemia in chronic kidney disease: Secondary | ICD-10-CM | POA: Diagnosis not present

## 2023-11-05 DIAGNOSIS — D509 Iron deficiency anemia, unspecified: Secondary | ICD-10-CM | POA: Diagnosis not present

## 2023-11-07 DIAGNOSIS — D509 Iron deficiency anemia, unspecified: Secondary | ICD-10-CM | POA: Diagnosis not present

## 2023-11-07 DIAGNOSIS — N2581 Secondary hyperparathyroidism of renal origin: Secondary | ICD-10-CM | POA: Diagnosis not present

## 2023-11-07 DIAGNOSIS — E1122 Type 2 diabetes mellitus with diabetic chronic kidney disease: Secondary | ICD-10-CM | POA: Diagnosis not present

## 2023-11-07 DIAGNOSIS — N186 End stage renal disease: Secondary | ICD-10-CM | POA: Diagnosis not present

## 2023-11-07 DIAGNOSIS — Z23 Encounter for immunization: Secondary | ICD-10-CM | POA: Diagnosis not present

## 2023-11-07 DIAGNOSIS — D631 Anemia in chronic kidney disease: Secondary | ICD-10-CM | POA: Diagnosis not present

## 2023-11-09 DIAGNOSIS — D509 Iron deficiency anemia, unspecified: Secondary | ICD-10-CM | POA: Diagnosis not present

## 2023-11-09 DIAGNOSIS — N186 End stage renal disease: Secondary | ICD-10-CM | POA: Diagnosis not present

## 2023-11-09 DIAGNOSIS — Z23 Encounter for immunization: Secondary | ICD-10-CM | POA: Diagnosis not present

## 2023-11-09 DIAGNOSIS — N2581 Secondary hyperparathyroidism of renal origin: Secondary | ICD-10-CM | POA: Diagnosis not present

## 2023-11-09 DIAGNOSIS — D631 Anemia in chronic kidney disease: Secondary | ICD-10-CM | POA: Diagnosis not present

## 2023-11-12 DIAGNOSIS — N2581 Secondary hyperparathyroidism of renal origin: Secondary | ICD-10-CM | POA: Diagnosis not present

## 2023-11-12 DIAGNOSIS — Z23 Encounter for immunization: Secondary | ICD-10-CM | POA: Diagnosis not present

## 2023-11-12 DIAGNOSIS — N186 End stage renal disease: Secondary | ICD-10-CM | POA: Diagnosis not present

## 2023-11-12 DIAGNOSIS — D631 Anemia in chronic kidney disease: Secondary | ICD-10-CM | POA: Diagnosis not present

## 2023-11-12 DIAGNOSIS — D509 Iron deficiency anemia, unspecified: Secondary | ICD-10-CM | POA: Diagnosis not present

## 2023-11-14 DIAGNOSIS — D509 Iron deficiency anemia, unspecified: Secondary | ICD-10-CM | POA: Diagnosis not present

## 2023-11-14 DIAGNOSIS — N186 End stage renal disease: Secondary | ICD-10-CM | POA: Diagnosis not present

## 2023-11-14 DIAGNOSIS — N2581 Secondary hyperparathyroidism of renal origin: Secondary | ICD-10-CM | POA: Diagnosis not present

## 2023-11-14 DIAGNOSIS — Z23 Encounter for immunization: Secondary | ICD-10-CM | POA: Diagnosis not present

## 2023-11-14 DIAGNOSIS — D631 Anemia in chronic kidney disease: Secondary | ICD-10-CM | POA: Diagnosis not present

## 2023-11-16 DIAGNOSIS — N186 End stage renal disease: Secondary | ICD-10-CM | POA: Diagnosis not present

## 2023-11-16 DIAGNOSIS — D509 Iron deficiency anemia, unspecified: Secondary | ICD-10-CM | POA: Diagnosis not present

## 2023-11-16 DIAGNOSIS — D631 Anemia in chronic kidney disease: Secondary | ICD-10-CM | POA: Diagnosis not present

## 2023-11-16 DIAGNOSIS — Z23 Encounter for immunization: Secondary | ICD-10-CM | POA: Diagnosis not present

## 2023-11-16 DIAGNOSIS — N2581 Secondary hyperparathyroidism of renal origin: Secondary | ICD-10-CM | POA: Diagnosis not present

## 2023-11-19 DIAGNOSIS — N2581 Secondary hyperparathyroidism of renal origin: Secondary | ICD-10-CM | POA: Diagnosis not present

## 2023-11-19 DIAGNOSIS — Z23 Encounter for immunization: Secondary | ICD-10-CM | POA: Diagnosis not present

## 2023-11-19 DIAGNOSIS — D509 Iron deficiency anemia, unspecified: Secondary | ICD-10-CM | POA: Diagnosis not present

## 2023-11-19 DIAGNOSIS — D631 Anemia in chronic kidney disease: Secondary | ICD-10-CM | POA: Diagnosis not present

## 2023-11-19 DIAGNOSIS — N186 End stage renal disease: Secondary | ICD-10-CM | POA: Diagnosis not present

## 2023-11-21 DIAGNOSIS — N2581 Secondary hyperparathyroidism of renal origin: Secondary | ICD-10-CM | POA: Diagnosis not present

## 2023-11-21 DIAGNOSIS — D631 Anemia in chronic kidney disease: Secondary | ICD-10-CM | POA: Diagnosis not present

## 2023-11-21 DIAGNOSIS — N186 End stage renal disease: Secondary | ICD-10-CM | POA: Diagnosis not present

## 2023-11-21 DIAGNOSIS — Z23 Encounter for immunization: Secondary | ICD-10-CM | POA: Diagnosis not present

## 2023-11-21 DIAGNOSIS — D509 Iron deficiency anemia, unspecified: Secondary | ICD-10-CM | POA: Diagnosis not present

## 2023-11-23 DIAGNOSIS — N186 End stage renal disease: Secondary | ICD-10-CM | POA: Diagnosis not present

## 2023-11-23 DIAGNOSIS — D631 Anemia in chronic kidney disease: Secondary | ICD-10-CM | POA: Diagnosis not present

## 2023-11-23 DIAGNOSIS — Z23 Encounter for immunization: Secondary | ICD-10-CM | POA: Diagnosis not present

## 2023-11-23 DIAGNOSIS — N2581 Secondary hyperparathyroidism of renal origin: Secondary | ICD-10-CM | POA: Diagnosis not present

## 2023-11-23 DIAGNOSIS — D509 Iron deficiency anemia, unspecified: Secondary | ICD-10-CM | POA: Diagnosis not present

## 2023-11-26 DIAGNOSIS — Z23 Encounter for immunization: Secondary | ICD-10-CM | POA: Diagnosis not present

## 2023-11-26 DIAGNOSIS — N186 End stage renal disease: Secondary | ICD-10-CM | POA: Diagnosis not present

## 2023-11-26 DIAGNOSIS — D631 Anemia in chronic kidney disease: Secondary | ICD-10-CM | POA: Diagnosis not present

## 2023-11-26 DIAGNOSIS — D509 Iron deficiency anemia, unspecified: Secondary | ICD-10-CM | POA: Diagnosis not present

## 2023-11-26 DIAGNOSIS — N2581 Secondary hyperparathyroidism of renal origin: Secondary | ICD-10-CM | POA: Diagnosis not present

## 2023-11-28 DIAGNOSIS — D509 Iron deficiency anemia, unspecified: Secondary | ICD-10-CM | POA: Diagnosis not present

## 2023-11-28 DIAGNOSIS — N2581 Secondary hyperparathyroidism of renal origin: Secondary | ICD-10-CM | POA: Diagnosis not present

## 2023-11-28 DIAGNOSIS — D631 Anemia in chronic kidney disease: Secondary | ICD-10-CM | POA: Diagnosis not present

## 2023-11-28 DIAGNOSIS — N186 End stage renal disease: Secondary | ICD-10-CM | POA: Diagnosis not present

## 2023-11-28 DIAGNOSIS — Z23 Encounter for immunization: Secondary | ICD-10-CM | POA: Diagnosis not present

## 2023-11-29 DIAGNOSIS — I770 Arteriovenous fistula, acquired: Secondary | ICD-10-CM | POA: Diagnosis not present

## 2023-11-29 DIAGNOSIS — N186 End stage renal disease: Secondary | ICD-10-CM | POA: Diagnosis not present

## 2023-11-29 DIAGNOSIS — Z992 Dependence on renal dialysis: Secondary | ICD-10-CM | POA: Diagnosis not present

## 2023-11-30 DIAGNOSIS — N2581 Secondary hyperparathyroidism of renal origin: Secondary | ICD-10-CM | POA: Diagnosis not present

## 2023-11-30 DIAGNOSIS — D631 Anemia in chronic kidney disease: Secondary | ICD-10-CM | POA: Diagnosis not present

## 2023-11-30 DIAGNOSIS — N186 End stage renal disease: Secondary | ICD-10-CM | POA: Diagnosis not present

## 2023-11-30 DIAGNOSIS — D509 Iron deficiency anemia, unspecified: Secondary | ICD-10-CM | POA: Diagnosis not present

## 2023-11-30 DIAGNOSIS — Z23 Encounter for immunization: Secondary | ICD-10-CM | POA: Diagnosis not present

## 2023-12-03 DIAGNOSIS — D631 Anemia in chronic kidney disease: Secondary | ICD-10-CM | POA: Diagnosis not present

## 2023-12-03 DIAGNOSIS — N186 End stage renal disease: Secondary | ICD-10-CM | POA: Diagnosis not present

## 2023-12-03 DIAGNOSIS — D509 Iron deficiency anemia, unspecified: Secondary | ICD-10-CM | POA: Diagnosis not present

## 2023-12-03 DIAGNOSIS — Z23 Encounter for immunization: Secondary | ICD-10-CM | POA: Diagnosis not present

## 2023-12-03 DIAGNOSIS — N2581 Secondary hyperparathyroidism of renal origin: Secondary | ICD-10-CM | POA: Diagnosis not present

## 2023-12-05 DIAGNOSIS — Z992 Dependence on renal dialysis: Secondary | ICD-10-CM | POA: Diagnosis not present

## 2023-12-05 DIAGNOSIS — E1122 Type 2 diabetes mellitus with diabetic chronic kidney disease: Secondary | ICD-10-CM | POA: Diagnosis not present

## 2023-12-05 DIAGNOSIS — N186 End stage renal disease: Secondary | ICD-10-CM | POA: Diagnosis not present

## 2023-12-06 DIAGNOSIS — Y832 Surgical operation with anastomosis, bypass or graft as the cause of abnormal reaction of the patient, or of later complication, without mention of misadventure at the time of the procedure: Secondary | ICD-10-CM | POA: Diagnosis not present

## 2023-12-06 DIAGNOSIS — T82590A Other mechanical complication of surgically created arteriovenous fistula, initial encounter: Secondary | ICD-10-CM | POA: Diagnosis not present

## 2023-12-06 DIAGNOSIS — N186 End stage renal disease: Secondary | ICD-10-CM | POA: Diagnosis not present

## 2023-12-06 DIAGNOSIS — Z992 Dependence on renal dialysis: Secondary | ICD-10-CM | POA: Diagnosis not present

## 2023-12-11 DIAGNOSIS — T82898A Other specified complication of vascular prosthetic devices, implants and grafts, initial encounter: Secondary | ICD-10-CM | POA: Diagnosis not present

## 2023-12-11 DIAGNOSIS — Z992 Dependence on renal dialysis: Secondary | ICD-10-CM | POA: Diagnosis not present

## 2023-12-11 DIAGNOSIS — I770 Arteriovenous fistula, acquired: Secondary | ICD-10-CM | POA: Diagnosis not present

## 2023-12-11 DIAGNOSIS — N186 End stage renal disease: Secondary | ICD-10-CM | POA: Diagnosis not present

## 2023-12-11 DIAGNOSIS — I729 Aneurysm of unspecified site: Secondary | ICD-10-CM | POA: Diagnosis not present

## 2023-12-11 DIAGNOSIS — Y832 Surgical operation with anastomosis, bypass or graft as the cause of abnormal reaction of the patient, or of later complication, without mention of misadventure at the time of the procedure: Secondary | ICD-10-CM | POA: Diagnosis not present

## 2023-12-11 DIAGNOSIS — E1122 Type 2 diabetes mellitus with diabetic chronic kidney disease: Secondary | ICD-10-CM | POA: Diagnosis not present

## 2023-12-11 DIAGNOSIS — I120 Hypertensive chronic kidney disease with stage 5 chronic kidney disease or end stage renal disease: Secondary | ICD-10-CM | POA: Diagnosis not present

## 2023-12-11 DIAGNOSIS — T82590A Other mechanical complication of surgically created arteriovenous fistula, initial encounter: Secondary | ICD-10-CM | POA: Diagnosis not present

## 2023-12-27 DIAGNOSIS — I770 Arteriovenous fistula, acquired: Secondary | ICD-10-CM | POA: Diagnosis not present

## 2024-01-05 DIAGNOSIS — N186 End stage renal disease: Secondary | ICD-10-CM | POA: Diagnosis not present

## 2024-01-05 DIAGNOSIS — Z992 Dependence on renal dialysis: Secondary | ICD-10-CM | POA: Diagnosis not present

## 2024-01-09 DIAGNOSIS — E1122 Type 2 diabetes mellitus with diabetic chronic kidney disease: Secondary | ICD-10-CM | POA: Diagnosis not present

## 2024-01-10 DIAGNOSIS — I770 Arteriovenous fistula, acquired: Secondary | ICD-10-CM | POA: Diagnosis not present

## 2024-01-10 DIAGNOSIS — Z992 Dependence on renal dialysis: Secondary | ICD-10-CM | POA: Diagnosis not present

## 2024-01-10 DIAGNOSIS — N186 End stage renal disease: Secondary | ICD-10-CM | POA: Diagnosis not present

## 2024-01-30 DIAGNOSIS — N186 End stage renal disease: Secondary | ICD-10-CM | POA: Diagnosis not present

## 2024-01-30 DIAGNOSIS — Z992 Dependence on renal dialysis: Secondary | ICD-10-CM | POA: Diagnosis not present

## 2024-01-30 DIAGNOSIS — Z452 Encounter for adjustment and management of vascular access device: Secondary | ICD-10-CM | POA: Diagnosis not present

## 2024-03-19 NOTE — Progress Notes (Signed)
 Austin Levy                                          MRN: [id number]   03/19/2024   The [facility] Quality Team Specialist reviewed this patient medical record for the purposes of chart review for care gap closure. The following were reviewed: chart review for care gap closure-glycemic status assessment.    [facility] Quality Team
